# Patient Record
Sex: Female | Born: 2000 | Hispanic: Yes | Marital: Single | Smoking: Never smoker
Health system: Southern US, Community
[De-identification: ages and names within clinical notes are randomized; demographics above are authoritative.]

## PROBLEM LIST (undated history)

## (undated) DIAGNOSIS — K5090 Crohn's disease, unspecified, without complications: Secondary | ICD-10-CM

## (undated) DIAGNOSIS — D509 Iron deficiency anemia, unspecified: Secondary | ICD-10-CM

## (undated) DIAGNOSIS — T380X5A Adverse effect of glucocorticoids and synthetic analogues, initial encounter: Secondary | ICD-10-CM

## (undated) DIAGNOSIS — M8580 Other specified disorders of bone density and structure, unspecified site: Secondary | ICD-10-CM

## (undated) HISTORY — DX: Crohn's disease, unspecified, without complications: K50.90

## (undated) HISTORY — DX: Adverse effect of glucocorticoids and synthetic analogues, initial encounter: M85.80

## (undated) HISTORY — DX: Adverse effect of glucocorticoids and synthetic analogues, initial encounter: T38.0X5A

---
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
## 2018-01-31 DIAGNOSIS — K5090 Crohn's disease, unspecified, without complications: Secondary | ICD-10-CM | POA: Diagnosis not present

## 2018-01-31 DIAGNOSIS — E559 Vitamin D deficiency, unspecified: Secondary | ICD-10-CM | POA: Diagnosis not present

## 2018-03-09 DIAGNOSIS — J069 Acute upper respiratory infection, unspecified: Secondary | ICD-10-CM | POA: Diagnosis not present

## 2018-05-13 DIAGNOSIS — J069 Acute upper respiratory infection, unspecified: Secondary | ICD-10-CM | POA: Diagnosis not present

## 2018-05-13 DIAGNOSIS — R05 Cough: Secondary | ICD-10-CM | POA: Diagnosis not present

## 2018-08-22 DIAGNOSIS — L723 Sebaceous cyst: Secondary | ICD-10-CM | POA: Diagnosis not present

## 2018-08-22 DIAGNOSIS — K5010 Crohn's disease of large intestine without complications: Secondary | ICD-10-CM | POA: Diagnosis not present

## 2018-10-16 ENCOUNTER — Ambulatory Visit: Payer: Self-pay | Admitting: Family Medicine

## 2018-10-17 ENCOUNTER — Ambulatory Visit (INDEPENDENT_AMBULATORY_CARE_PROVIDER_SITE_OTHER): Payer: BC Managed Care – PPO | Admitting: Family Medicine

## 2018-10-17 ENCOUNTER — Encounter: Payer: Self-pay | Admitting: Family Medicine

## 2018-10-17 VITALS — BP 112/70 | HR 89 | Temp 98.8°F | Resp 14 | Ht 60.0 in | Wt 108.6 lb

## 2018-10-17 DIAGNOSIS — Z23 Encounter for immunization: Secondary | ICD-10-CM

## 2018-10-17 DIAGNOSIS — T380X5A Adverse effect of glucocorticoids and synthetic analogues, initial encounter: Secondary | ICD-10-CM

## 2018-10-17 DIAGNOSIS — K5010 Crohn's disease of large intestine without complications: Secondary | ICD-10-CM | POA: Diagnosis not present

## 2018-10-17 DIAGNOSIS — D849 Immunodeficiency, unspecified: Secondary | ICD-10-CM

## 2018-10-17 DIAGNOSIS — Z0000 Encounter for general adult medical examination without abnormal findings: Secondary | ICD-10-CM | POA: Diagnosis not present

## 2018-10-17 DIAGNOSIS — D899 Disorder involving the immune mechanism, unspecified: Secondary | ICD-10-CM | POA: Diagnosis not present

## 2018-10-17 DIAGNOSIS — M8580 Other specified disorders of bone density and structure, unspecified site: Secondary | ICD-10-CM | POA: Diagnosis not present

## 2018-10-17 LAB — TSH: TSH: 1.42 u[IU]/mL (ref 0.35–5.50)

## 2018-10-17 LAB — COMPREHENSIVE METABOLIC PANEL
ALT: 7 U/L (ref 0–35)
AST: 13 U/L (ref 0–37)
Albumin: 4.5 g/dL (ref 3.5–5.2)
Alkaline Phosphatase: 37 U/L — ABNORMAL LOW (ref 39–117)
BUN: 10 mg/dL (ref 6–23)
CO2: 25 mEq/L (ref 19–32)
Calcium: 9.4 mg/dL (ref 8.4–10.5)
Chloride: 105 mEq/L (ref 96–112)
Creatinine, Ser: 0.67 mg/dL (ref 0.40–1.20)
GFR: 111.9 mL/min (ref >60.00)
Glucose, Bld: 99 mg/dL (ref 70–99)
Potassium: 3.7 mEq/L (ref 3.5–5.1)
Sodium: 138 mEq/L (ref 135–145)
Total Bilirubin: 0.3 mg/dL (ref 0.2–1.2)
Total Protein: 7.3 g/dL (ref 6.0–8.3)

## 2018-10-17 LAB — CBC WITH DIFFERENTIAL/PLATELET
Basophils Absolute: 0 10*3/uL (ref 0.0–0.1)
Basophils Relative: 0.5 % (ref 0.0–3.0)
Eosinophils Absolute: 0.1 10*3/uL (ref 0.0–0.7)
Eosinophils Relative: 4.4 % (ref 0.0–5.0)
HCT: 32.5 % — ABNORMAL LOW (ref 36.0–46.0)
Hemoglobin: 10.8 g/dL — ABNORMAL LOW (ref 12.0–15.0)
Lymphocytes Relative: 26.7 % (ref 12.0–46.0)
Lymphs Abs: 0.9 10*3/uL (ref 0.7–4.0)
MCHC: 33.1 g/dL (ref 30.0–36.0)
MCV: 101.7 fl — ABNORMAL HIGH (ref 78.0–100.0)
Monocytes Absolute: 0.2 10*3/uL (ref 0.1–1.0)
Monocytes Relative: 7.2 % (ref 3.0–12.0)
Neutro Abs: 2 10*3/uL (ref 1.4–7.7)
Neutrophils Relative %: 61.2 % (ref 43.0–77.0)
Platelets: 241 10*3/uL (ref 150.0–400.0)
RBC: 3.2 Mil/uL — ABNORMAL LOW (ref 3.87–5.11)
RDW: 15.9 % — ABNORMAL HIGH (ref 11.5–14.6)
WBC: 3.2 10*3/uL — ABNORMAL LOW (ref 4.5–10.5)

## 2018-10-17 LAB — VITAMIN D 25 HYDROXY (VIT D DEFICIENCY, FRACTURES): VITD: 28.53 ng/mL — ABNORMAL LOW (ref 30.00–100.00)

## 2018-10-17 NOTE — Progress Notes (Signed)
Subjective  Chief Complaint  Patient presents with  . Establish Care    Previous PCP was at [facility], [place]  . Annual Exam    Non fasting    HPI: Claudia Martinez is a 18 y.o. female who presents to [facility] at [place] today for a Female Wellness Visit.   Wellness Visit: annual visit with health maintenance review and exam without Pap; sees gyn and gastro.   Very pleasant 18 year old female who was diagnosed with Crohn's disease 2 or 3 years ago.  She is currently well controlled on current medications.  She is managed by [facility] gastroenterology.  I have reviewed notes.  She is on chronic immunosuppression.  She feels well and has no concerns today.  Health maintenance: She sees a gynecologist.  She also sees an endocrinologist due to mild steroid-induced osteopenia.  She is on vitamin D and calcium therapy at times.  She is scheduled for her next bone density in a few months.  She is due for a Tdap.  Her immunizations are up-to-date.  Social history: She lives with her parents.  She has 2 older siblings who are out of the home.  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  She is happy without history of mood disorders.  Originally she is from [place].  They moved to [facility] about 7 years ago.  She likes it here.  Assessment  1. Annual physical exam   2. Crohn's disease of large intestine without complication ([place])   3. Immunosuppression ([place])   4. Need for Tdap vaccination   5. Steroid-induced osteopenia      Plan  Female Wellness Visit:  Age appropriate Health Maintenance and Prevention measures were discussed with patient. Included topics are cancer screening recommendations, ways to keep healthy (see AVS) including dietary and exercise recommendations, regular eye and dental care, use of seat belts, and avoidance of moderate alcohol use and tobacco use.   BMI: discussed patient's BMI and encouraged  positive lifestyle modifications to help get to or maintain a target BMI.  HM needs and immunizations were addressed and ordered. See below for orders. See HM and immunization section for updates.  Tdap updated today.  Routine labs and screening tests ordered including cmp, cbc and lipids where appropriate.  Discussed recommendations regarding Vit D and calcium supplementation (see AVS)  Chronic disease per GI.  Immunosuppressed  Follow up: Return in about 1 year (around 10/17/2019) for complete physical.   Orders Placed This Encounter  Procedures  . Tdap vaccine greater than or equal to 7yo IM  . CBC with Differential/Platelet  . Comprehensive metabolic panel  . TSH  . VITAMIN D 25 Hydroxy (Vit-D Deficiency, Fractures)   No orders of the defined types were placed in this encounter.     Lifestyle: Body mass index is 21.21 kg/m. Wt Readings from Last 3 Encounters:  10/17/18 108 lb 9.6 oz (49.3 kg)    Patient Active Problem List   Diagnosis Date Noted  . Steroid-induced osteopenia 10/17/2018    Sees Dr. Chalmers Cater   . Immunosuppression ([place]) 09/14/2015  . Crohn's disease of large intestine without complication ([place]) 81/27/5170   Health Maintenance  Topic Date Due  . HIV Screening  07/06/2013  . TETANUS/TDAP  07/06/2017  . INFLUENZA VACCINE  10/11/2018   There is no immunization history for the selected administration types on file for this patient. We updated and reviewed the patient's past history in  detail and it is documented below. Allergies: Patient is allergic to other. Past Medical History Patient  has a past medical history of Crohn disease ([place]), Iron deficiency anemia, and Steroid-induced osteopenia. Past Surgical History Patient  has no past surgical history on file. Family History: Patient family history is not on file. Social History:  Patient  reports that she has never smoked. She has never used smokeless tobacco. She reports that she does not drink  alcohol or use drugs.  Review of Systems: Constitutional: negative for fever or malaise Ophthalmic: negative for photophobia, double vision or loss of vision Cardiovascular: negative for chest pain, dyspnea on exertion, or new LE swelling Respiratory: negative for SOB or persistent cough Gastrointestinal: negative for abdominal pain, change in bowel habits or melena Genitourinary: negative for dysuria or gross hematuria, no abnormal uterine bleeding or disharge Musculoskeletal: negative for new gait disturbance or muscular weakness Integumentary: negative for new or persistent rashes, no breast lumps Neurological: negative for TIA or stroke symptoms Psychiatric: negative for SI or delusions Allergic/Immunologic: negative for hives Patient Care Team    Relationship Specialty Notifications Start End  Leamon Arnt, MD PCP - General Family Medicine  10/17/18     Objective  Vitals: BP 112/70   Pulse 89   Temp 98.8 F (37.1 C) (Tympanic)   Resp 14   Ht 5' (1.524 m)   Wt 108 lb 9.6 oz (49.3 kg)   LMP 09/24/2018   SpO2 100%   BMI 21.21 kg/m  General:  Well developed, well nourished, no acute distress, thin Psych:  Alert and orientedx3,normal mood and affect HEENT:  Normocephalic, atraumatic, non-icteric sclera, PERRL, oropharynx is clear without mass or exudate, supple neck without adenopathy, mass or thyromegaly Cardiovascular:  Normal S1, S2, RRR without gallop, rub or murmur, nondisplaced PMI Respiratory:  Good breath sounds bilaterally, CTAB with normal respiratory effort Gastrointestinal: normal bowel sounds, soft, non-tender, no noted masses. No HSM MSK: no deformities, contusions. Joints are without erythema or swelling. Spine and CVA region are nontender Skin:  Warm, no rashes or suspicious lesions noted Neurologic:    Mental status is normal. CN 2-11 are normal. Gross motor and sensory exams are normal. Normal gait. No tremor   Commons side effects, risks, benefits, and  alternatives for medications and treatment plan prescribed today were discussed, and the patient expressed understanding of the given instructions. Patient is instructed to call or message via MyChart if he/she has any questions or concerns regarding our treatment plan. No barriers to understanding were identified. We discussed Red Flag symptoms and signs in detail. Patient expressed understanding regarding what to do in case of urgent or emergency type symptoms.   Medication list was reconciled, printed and provided to the patient in AVS. Patient instructions and summary information was reviewed with the patient as documented in the AVS. This note was prepared with assistance of Dragon voice recognition software. Occasional wrong-word or sound-a-like substitutions may have occurred due to the inherent limitations of voice recognition software

## 2018-10-17 NOTE — Patient Instructions (Addendum)
Please return in 12 months for your annual complete physical; please come fasting.  I will release your lab results to you on your MyChart account with further instructions. Please reply with any questions.  Please sign up via the link I sent as a text message.   It was a pleasure meeting you today! Thank you for choosing [place] to meet your healthcare needs! I truly look forward to working with you. If you have any questions or concerns, please send me a message via Mychart or call the office at [phone].  Today you were given your Tdap vaccination. This is good for 10 years.  Preventive Care 20-59 Years Old, Female Preventive care refers to visits with your health care provider and lifestyle choices that can promote health and wellness. This includes:  A yearly physical exam. This may also be called an annual well check.  Regular dental visits and eye exams.  Immunizations.  Screening for certain conditions.  Healthy lifestyle choices, such as eating a healthy diet, getting regular exercise, not using drugs or products that contain nicotine and tobacco, and limiting alcohol use. What can I expect for my preventive care visit? Physical exam Your health care provider will check your:  Height and weight. This may be used to calculate body mass index (BMI), which tells if you are at a healthy weight.  Heart rate and blood pressure.  Skin for abnormal spots. Counseling Your health care provider may ask you questions about your:  Alcohol, tobacco, and drug use.  Emotional well-being.  Home and relationship well-being.  Sexual activity.  Eating habits.  Work and work Statistician.  Method of birth control.  Menstrual cycle.  Pregnancy history. What immunizations do I need?  Influenza (flu) vaccine  This is recommended every year. Tetanus, diphtheria, and pertussis (Tdap) vaccine  You may need a Td booster every 10 years. Varicella (chickenpox) vaccine  You may  need this if you have not been vaccinated. Human papillomavirus (HPV) vaccine  If recommended by your health care provider, you may need three doses over 6 months. Measles, mumps, and rubella (MMR) vaccine  You may need at least one dose of MMR. You may also need a second dose. Meningococcal conjugate (MenACWY) vaccine  One dose is recommended if you are age 73-21 years and a first-year college student living in a residence hall, or if you have one of several medical conditions. You may also need additional booster doses. Pneumococcal conjugate (PCV13) vaccine  You may need this if you have certain conditions and were not previously vaccinated. Pneumococcal polysaccharide (PPSV23) vaccine  You may need one or two doses if you smoke cigarettes or if you have certain conditions. Hepatitis A vaccine  You may need this if you have certain conditions or if you travel or work in places where you may be exposed to hepatitis A. Hepatitis B vaccine  You may need this if you have certain conditions or if you travel or work in places where you may be exposed to hepatitis B. Haemophilus influenzae type b (Hib) vaccine  You may need this if you have certain conditions. You may receive vaccines as individual doses or as more than one vaccine together in one shot (combination vaccines). Talk with your health care provider about the risks and benefits of combination vaccines. What tests do I need?  Blood tests  Lipid and cholesterol levels. These may be checked every 5 years starting at age 36.  Hepatitis C test.  Hepatitis B test.  Screening  Diabetes screening. This is done by checking your blood sugar (glucose) after you have not eaten for a while (fasting).  Sexually transmitted disease (STD) testing.  BRCA-related cancer screening. This may be done if you have a family history of breast, ovarian, tubal, or peritoneal cancers.  Pelvic exam and Pap test. This may be done every 3 years  starting at age 44. Starting at age 37, this may be done every 5 years if you have a Pap test in combination with an HPV test. Talk with your health care provider about your test results, treatment options, and if necessary, the need for more tests. Follow these instructions at home: Eating and drinking   Eat a diet that includes fresh fruits and vegetables, whole grains, lean protein, and low-fat dairy.  Take vitamin and mineral supplements as recommended by your health care provider.  Do not drink alcohol if: ? Your health care provider tells you not to drink. ? You are pregnant, may be pregnant, or are planning to become pregnant.  If you drink alcohol: ? Limit how much you have to 0-1 drink a day. ? Be aware of how much alcohol is in your drink. In the [place] one drink equals one 12 oz bottle of beer (355 mL), one 5 oz glass of wine (148 mL), or one 1 oz glass of hard liquor (44 mL). Lifestyle  Take daily care of your teeth and gums.  Stay active. Exercise for at least 30 minutes on 5 or more days each week.  Do not use any products that contain nicotine or tobacco, such as cigarettes, e-cigarettes, and chewing tobacco. If you need help quitting, ask your health care provider.  If you are sexually active, practice safe sex. Use a condom or other form of birth control (contraception) in order to prevent pregnancy and STIs (sexually transmitted infections). If you plan to become pregnant, see your health care provider for a preconception visit. What's next?  Visit your health care provider once a year for a well check visit.  Ask your health care provider how often you should have your eyes and teeth checked.  Stay up to date on all vaccines. This information is not intended to replace advice given to you by your health care provider. Make sure you discuss any questions you have with your health care provider. Document Released: 04/24/2001 Document Revised: 11/07/2017 Document  Reviewed: 11/07/2017 Elsevier Patient Education  2020 [employer].

## 2018-10-21 NOTE — Progress Notes (Signed)
Please add on Vit b12 and folate if it isnt too late, dx: anemia, macrocytic Thanks, Dr. Jonni Sanger '

## 2018-10-21 NOTE — Progress Notes (Signed)
Please call patient: I have reviewed his/her lab results. Labs mostly look fine but she is anemic: I recommend taking a MVI daily and iron daily to correct this. As well her vit d is a little low, so Vit d3 2000 units daily as well. Thanks.

## 2018-11-05 ENCOUNTER — Ambulatory Visit (INDEPENDENT_AMBULATORY_CARE_PROVIDER_SITE_OTHER): Payer: BC Managed Care – PPO | Admitting: Family Medicine

## 2018-11-05 ENCOUNTER — Encounter: Payer: Self-pay | Admitting: Family Medicine

## 2018-11-05 ENCOUNTER — Other Ambulatory Visit: Payer: Self-pay

## 2018-11-05 VITALS — Temp 101.1°F

## 2018-11-05 DIAGNOSIS — R509 Fever, unspecified: Secondary | ICD-10-CM | POA: Diagnosis not present

## 2018-11-05 DIAGNOSIS — L2489 Irritant contact dermatitis due to other agents: Secondary | ICD-10-CM | POA: Diagnosis not present

## 2018-11-05 DIAGNOSIS — R6889 Other general symptoms and signs: Secondary | ICD-10-CM | POA: Diagnosis not present

## 2018-11-05 DIAGNOSIS — Z20822 Contact with and (suspected) exposure to covid-19: Secondary | ICD-10-CM

## 2018-11-05 MED ORDER — TRIAMCINOLONE ACETONIDE 0.1 % EX CREA: 1.0000 "application " | TOPICAL_CREAM | Freq: Two times a day (BID) | CUTANEOUS | 0 refills | Status: DC

## 2018-11-05 NOTE — Progress Notes (Signed)
Virtual Visit via Video Note  Subjective  CC:  Chief Complaint  Patient presents with  . Ear irritation    Started over the weekend, has tried Benadryl spray and tabs, has tried OTC eczema creams with minimal relief.. Reports redness, raw, and some swelling to bilateral ears.  . Fever    Started today 101.1 (told to take Tylenol), does work at [employer], & unsure of exposure     I connected with Marlo Arriola on 11/05/18 at  2:20 PM EDT by a video enabled telemedicine application and verified that I am speaking with the correct person using two identifiers. Location patient: Home Location provider: [facility] [place], Office Persons participating in the virtual visit: Claudia Martinez, Leamon Arnt, MD Lilli Light, CMA, and patient's mother  I discussed the limitations of evaluation and management by telemedicine and the availability of in person appointments. The patient expressed understanding and agreed to proceed. HPI: Claudia Martinez is a 18 y.o. female who was contacted today to address the problems listed above in the chief complaint. . One week h/o ear soreness with cracking and ithcing and redness. Wears a medical mask all day at work. No URI or covid sxs. Then today with temp to 101. 99.1 after tyelnol. Feels well but "flushed" - thought related to ears. No ear pain. No cold sxs. No GI sxs.  Assessment  1. Irritant contact dermatitis due to other agents   2. Fever, unspecified fever cause      Plan   Irritant contact der:  Reassured. Inconsistent with cellulitis but at risk due to cracking. Will monitor. Start steroid cream. Avoid mask.   Fever: ? Cause. Test for covid. Monitor sxs. No work until cleared.  I discussed the assessment and treatment plan with the patient. The patient was provided an opportunity to ask questions and all were answered. The patient agreed with the plan and demonstrated an understanding of the instructions.   The  patient was advised to call back or seek an in-person evaluation if the symptoms worsen or if the condition fails to improve as anticipated. Follow up: No follow-ups on file.  Visit date not found  Meds ordered this encounter  Medications  . triamcinolone cream (KENALOG) 0.1 %    Sig: Apply 1 application topically 2 (two) times daily. For 2 weeks, then as needed    Dispense:  45 g    Refill:  0      I reviewed the patients updated PMH, FH, and SocHx.    Patient Active Problem List   Diagnosis Date Noted  . 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 Immunosuppression ([place]) 09/14/2015  . Crohn's disease of large intestine without complication ([place]) 72/53/6644   Current Meds  Medication Sig  . azaTHIOprine (IMURAN) 50 MG tablet Take 1 tablet by mouth 2 (two) times daily.  . folic acid (FOLVITE) 1 MG tablet Take 1 mg by mouth daily.  Marland Kitchen PENTASA 500 MG CR capsule Take 3 capsules by mouth 2 (two) times daily.    Allergies: Patient is allergic to other. Family History: Patient family history is not on file. Social History:  Patient  reports that she has never smoked. She has never used smokeless tobacco. She reports that she does not drink alcohol or use drugs.  Review of Systems: Constitutional: Negative for fever malaise or anorexia Cardiovascular: negative for chest pain Respiratory: negative for SOB or persistent cough Gastrointestinal: negative for abdominal pain  OBJECTIVE Vitals:  Temp (!) 101.1 F (38.4 C) , currently 99 General: no acute distress , A&Ox3 Bilateral ears are mildly red but nontender with some flaking and cracking visible.  Leamon Arnt, MD

## 2018-11-06 ENCOUNTER — Encounter: Payer: Self-pay | Admitting: Family Medicine

## 2018-11-06 ENCOUNTER — Ambulatory Visit (INDEPENDENT_AMBULATORY_CARE_PROVIDER_SITE_OTHER): Payer: BC Managed Care – PPO | Admitting: Family Medicine

## 2018-11-06 VITALS — Temp 98.5°F | Ht 60.0 in | Wt 109.0 lb

## 2018-11-06 DIAGNOSIS — R21 Rash and other nonspecific skin eruption: Secondary | ICD-10-CM

## 2018-11-06 LAB — NOVEL CORONAVIRUS, NAA: SARS-CoV-2, NAA: NOT DETECTED

## 2018-11-06 NOTE — Progress Notes (Signed)
     Virtual Visit via Video Note  Subjective  CC:  Chief Complaint  Patient presents with  . Rash    Back of both legs, above ankles     I connected with Aaron Mose on 11/06/18 at 11:00 AM EDT by a video enabled telemedicine application and verified that I am speaking with the correct person using two identifiers. Location patient: Home Location provider: [facility] [place], Office Persons participating in the virtual visit: Shakiya Mcneary, Leamon Arnt, MD Lilli Light, [place] discussed the limitations of evaluation and management by telemedicine and the availability of in person appointments. The patient expressed understanding and agreed to proceed. HPI: Claudia Martinez is a 18 y.o. female who was contacted today to address the problems listed above in the chief complaint. . See yesterdays note. No more fevers. Ears are doing better after 2 treatments with steroid cream. But this am itching rash on both calves. No hives or systemic rash. No new exposures. Otherwise feels fine.   Assessment  1. Rash      Plan   rash:  ? Viral exanthem vs new contact irritant for other. Will monitor. Start antihistamie. covid test pending due to recent fever.  I discussed the assessment and treatment plan with the patient. The patient was provided an opportunity to ask questions and all were answered. The patient agreed with the plan and demonstrated an understanding of the instructions.   The patient was advised to call back or seek an in-person evaluation if the symptoms worsen or if the condition fails to improve as anticipated. Follow up: No follow-ups on file.  Visit date not found  No orders of the defined types were placed in this encounter.     I reviewed the patients updated PMH, FH, and SocHx.    Patient Active Problem List   Diagnosis Date Noted  . 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 Immunosuppression ([place]) 09/14/2015  . Crohn's disease of  large intestine without complication ([place]) 25/00/3704   Current Meds  Medication Sig  . azaTHIOprine (IMURAN) 50 MG tablet Take 1 tablet by mouth 2 (two) times daily.  . folic acid (FOLVITE) 1 MG tablet Take 1 mg by mouth daily.  Marland Kitchen PENTASA 500 MG CR capsule Take 3 capsules by mouth 2 (two) times daily.  Marland Kitchen triamcinolone cream (KENALOG) 0.1 % Apply 1 application topically 2 (two) times daily. For 2 weeks, then as needed    Allergies: Patient is allergic to other. Family History: Patient family history is not on file. Social History:  Patient  reports that she has never smoked. She has never used smokeless tobacco. She reports that she does not drink alcohol or use drugs.  Review of Systems: Constitutional: Negative for fever malaise or anorexia Cardiovascular: negative for chest pain Respiratory: negative for SOB or persistent cough Gastrointestinal: negative for abdominal pain  OBJECTIVE Vitals: Temp 98.5 F (36.9 C) (Temporal)   Ht 5' (1.524 m)   Wt 109 lb (49.4 kg)   BMI 21.29 kg/m  General: no acute distress , A&Ox3 Skin: calves: maculopapular rash.  Leamon Arnt, MD

## 2018-11-07 ENCOUNTER — Ambulatory Visit (INDEPENDENT_AMBULATORY_CARE_PROVIDER_SITE_OTHER): Payer: BC Managed Care – PPO | Admitting: Family Medicine

## 2018-11-07 ENCOUNTER — Encounter: Payer: Self-pay | Admitting: Family Medicine

## 2018-11-07 ENCOUNTER — Telehealth: Payer: Self-pay | Admitting: General Practice

## 2018-11-07 ENCOUNTER — Telehealth: Payer: Self-pay | Admitting: Physical Therapy

## 2018-11-07 VITALS — BP 116/64 | HR 98 | Temp 98.3°F | Ht 59.0 in | Wt 105.0 lb

## 2018-11-07 DIAGNOSIS — J029 Acute pharyngitis, unspecified: Secondary | ICD-10-CM | POA: Diagnosis not present

## 2018-11-07 DIAGNOSIS — J020 Streptococcal pharyngitis: Secondary | ICD-10-CM | POA: Diagnosis not present

## 2018-11-07 LAB — POCT RAPID STREP A (OFFICE): Rapid Strep A Screen: POSITIVE — AB

## 2018-11-07 MED ORDER — PENICILLIN V POTASSIUM 500 MG PO TABS: 500.0000 mg | ORAL_TABLET | Freq: Two times a day (BID) | ORAL | 0 refills | Status: AC

## 2018-11-07 MED ORDER — PREDNISONE 20 MG PO TABS: ORAL_TABLET | ORAL | 0 refills | Status: DC

## 2018-11-07 NOTE — Progress Notes (Signed)
Subjective  CC:  Chief Complaint  Patient presents with  . Acute Visit  . Sore Throat    Started this morning  . Rash    On back of both legs    HPI: Claudia Martinez is a 18 y.o. female who presents to the office today to address the problems listed above in the chief complaint.  See last notes: ears are still red and itching in spite of steroid cream.  Today low grade fever again and now sore throat. Also with red bumpy rash, worse on legs but some on back.   No n/v/d/abd pain, heachache, myalgias. covid testing was negative.   Immunosuprressed.   Assessment  1. Strep pharyngitis   2. Sore throat      Plan   Strep throat in immunosuppressed patient with scarlatiniform rash:  Educated and counseling. pcn x 5 days and supportive care  Add pred for irritant derm on ears. Doesn't look like cellulitis.   Follow up: prn  Visit date not found  Orders Placed This Encounter  Procedures  . POCT rapid strep A   Meds ordered this encounter  Medications  . penicillin v potassium (VEETID) 500 MG tablet    Sig: Take 1 tablet (500 mg total) by mouth 2 (two) times daily for 10 days.    Dispense:  20 tablet    Refill:  0  . predniSONE (DELTASONE) 20 MG tablet    Sig: Take 3 tabs daily for 3 days    Dispense:  9 tablet    Refill:  0      I reviewed the patients updated PMH, FH, and SocHx.    Patient Active Problem List   Diagnosis Date Noted  . 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 Immunosuppression ([place]) 09/14/2015  . Crohn's disease of large intestine without complication ([place]) 83/29/1916   Current Meds  Medication Sig  . azaTHIOprine (IMURAN) 50 MG tablet Take 1 tablet by mouth 2 (two) times daily.  . folic acid (FOLVITE) 1 MG tablet Take 1 mg by mouth daily.  Marland Kitchen PENTASA 500 MG CR capsule Take 3 capsules by mouth 2 (two) times daily.  Marland Kitchen triamcinolone cream (KENALOG) 0.1 % Apply 1 application topically 2 (two) times daily. For 2 weeks, then as needed     Allergies: Patient is allergic to other. Family History: Patient family history is not on file. Social History:  Patient  reports that she has never smoked. She has never used smokeless tobacco. She reports that she does not drink alcohol or use drugs.  Review of Systems: Constitutional: Negative for fever malaise or anorexia Cardiovascular: negative for chest pain Respiratory: negative for SOB or persistent cough Gastrointestinal: negative for abdominal pain  Objective  Vitals: BP 116/64 (BP Location: Left Arm, Patient Position: Sitting, Cuff Size: Normal)   Pulse 98   Temp 98.3 F (36.8 C) (Oral)   Ht 4' 11"  (1.499 m)   Wt 105 lb (47.6 kg)   SpO2 100%   BMI 21.21 kg/m  General: no acute distress , A&Ox3 HEENT: PEERL, conjunctiva normal, Oropharynx moist and red w/o exudate, no lad, ears are red but not swollen or warm, some flaking,neck is supple Cardiovascular:  RRR without murmur or gallop.  Respiratory:  Good breath sounds bilaterally, CTAB with normal respiratory effort Skin:  Warm, back with fine micropapillary rash; posterior calves with more prominent large maculopapular rash.   Office Visit on 11/07/2018  Component Date Value Ref Range Status  . Rapid Strep A Screen  11/07/2018 Positive* Negative Final      Commons side effects, risks, benefits, and alternatives for medications and treatment plan prescribed today were discussed, and the patient expressed understanding of the given instructions. Patient is instructed to call or message via MyChart if he/she has any questions or concerns regarding our treatment plan. No barriers to understanding were identified. We discussed Red Flag symptoms and signs in detail. Patient expressed understanding regarding what to do in case of urgent or emergency type symptoms.   Medication list was reconciled, printed and provided to the patient in AVS. Patient instructions and summary information was reviewed with the patient as  documented in the AVS. This note was prepared with assistance of Dragon voice recognition software. Occasional wrong-word or sound-a-like substitutions may have occurred due to the inherent limitations of voice recognition software

## 2018-11-07 NOTE — Telephone Encounter (Signed)
Patient informed of negative covid-19 result. Patient verbalized understanding.

## 2018-11-07 NOTE — Telephone Encounter (Signed)
Copied from [place] [id number]. Topic: General - Other >> Nov 07, 2018  3:02 PM Leward Quan A wrote: Reason for CRM: Patient mom called to say speak to Dr Jonni Sanger because she was not happy with the report of the treatment that Dr Jonni Sanger gave her daughter during her visit. Per patient  ""Dr Jonni Sanger stated that patient does not have to call her doctor for every little symptom"" mom Dyann Ruddle is asking for a call from Dr Jonni Sanger when she get a chance so that this issue can be resolved. Per mom after patients original complaint and getting tested and ruled out for covid patient still had symptoms and went back because Dr Jonni Sanger say come in if symptoms persist. Dyann Ruddle can be reached at Ph# [phone]

## 2018-11-07 NOTE — Patient Instructions (Signed)
Please follow up if symptoms do not improve or as needed.    Strep Throat, Adult Strep throat is an infection in the throat that is caused by bacteria. It is common during the cold months of the year. It mostly affects children who are 80-20 years old. However, people of all ages can get it at any time of the year. This infection spreads from person to person (is contagious) through coughing, sneezing, or having close contact. Your health care provider may use other names to describe the infection. It can be called tonsillitis (if there is swelling of the tonsils), or pharyngitis (if there is swelling at the back of the throat). What are the causes? This condition is caused by the Streptococcus pyogenes bacteria. What increases the risk? You are more likely to develop this condition if:  You care for school-age children, or are around school-age children. Children are more likely to get strep throat and may spread it to others.  You spend time in crowded places where the infection can spread easily.  You have close contact with someone who has strep throat. What are the signs or symptoms? Symptoms of this condition include:  Fever or chills.  Redness, swelling, or pain in the tonsils or throat.  Pain or difficulty when swallowing.  White or yellow spots on the tonsils or throat.  Tender glands in the neck and under the jaw.  Bad smelling breath.  Red rash all over the body. This is rare. How is this diagnosed? This condition is diagnosed by tests that check for the presence and the amount of bacteria that cause strep throat. They are:  Rapid strep test. Your throat is swabbed and checked for the presence of bacteria. Results are usually ready in minutes.  Throat culture test. Your throat is swabbed. The sample is placed in a cup that allows infections to grow. Results are usually ready in 1 or 2 days. How is this treated? This condition may be treated with:  Medicines that  kill germs (antibiotics).  Medicines that relieve pain or fever. These include: ? Ibuprofen or acetaminophen. ? Aspirin, only for patients who are over the age of 47. ? Throat lozenges. ? Throat sprays. Follow these instructions at home: Medicines   Take over-the-counter and prescription medicines only as told by your health care provider.  Take your antibiotic medicine as told by your health care provider. Do not stop taking the antibiotic even if you start to feel better. Eating and drinking   If you have trouble swallowing, try eating soft foods until your sore throat feels better.  Drink enough fluid to keep your urine pale yellow.  To help relieve pain, you may have: ? Warm fluids, such as soup and tea. ? Cold fluids, such as frozen desserts or popsicles. General instructions  Gargle with a salt-water mixture 3-4 times a day or as needed. To make a salt-water mixture, completely dissolve -1 tsp (3-6 g) of salt in 1 cup (237 mL) of warm water.  Get plenty of rest.  Stay home from work or school until you have been taking antibiotics for 24 hours.  Avoid smoking or being around people who smoke.  Keep all follow-up visits as told by your health care provider. This is important. How is this prevented?   Do not share food, drinking cups, or personal items that could cause the infection to spread to other people.  Wash your hands well with soap and water, and make sure that all  people in your house wash their hands well.  Have family members tested if they have a sore throat or fever. They may need an antibiotic if they have strep throat. Contact a health care provider if:  The glands in your neck continue to get bigger.  You develop a rash, cough, or earache.  You cough up a thick mucus that is green, yellow-brown, or bloody.  You have pain or discomfort that does not get better with medicine.  Your symptoms seem to be getting worse and not better.  You have a  fever. Get help right away if:  You have new symptoms, such as vomiting, severe headache, stiff or painful neck, chest pain, or shortness of breath.  You have severe throat pain, drooling, or changes in your voice.  You have swelling of the neck, or the skin on the neck becomes red and tender.  You have signs of dehydration, such as tiredness (fatigue), dry mouth, and decreased urination.  You become increasingly sleepy, or you cannot wake up completely.  Your joints become red or painful. Summary  Strep throat is an infection in the throat that is caused by the Streptococcus pyogenes bacteria. This infection is spread from person to person (is contagious) through coughing, sneezing, or having close contact.  Take your medicines, including antibiotics, as told by your health care provider. Do not stop taking the antibiotic even if you start to feel better.  To prevent the spread of germs, wash your hands well with soap and water. Have others do the same. Do not share food, drinking cups, or personal items.  Get help right away if you have new symptoms, such as vomiting, severe headache, stiff or painful neck, chest pain, or shortness of breath. This information is not intended to replace advice given to you by your health care provider. Make sure you discuss any questions you have with your health care provider. Document Released: 02/24/2000 Document Revised: 05/16/2018 Document Reviewed: 05/16/2018 Elsevier Patient Education  [place].

## 2018-11-10 NOTE — Telephone Encounter (Signed)
LMOM for Claudia Martinez, [place] mom. I will try her back or she can call me again.

## 2018-11-10 NOTE — Telephone Encounter (Signed)
See below

## 2018-11-10 NOTE — Telephone Encounter (Signed)
Spoke to both patient and her mother.

## 2018-11-14 DIAGNOSIS — L309 Dermatitis, unspecified: Secondary | ICD-10-CM | POA: Diagnosis not present

## 2018-11-18 DIAGNOSIS — M810 Age-related osteoporosis without current pathological fracture: Secondary | ICD-10-CM | POA: Diagnosis not present

## 2018-11-18 DIAGNOSIS — K5090 Crohn's disease, unspecified, without complications: Secondary | ICD-10-CM | POA: Diagnosis not present

## 2018-11-18 DIAGNOSIS — Z3040 Encounter for surveillance of contraceptives, unspecified: Secondary | ICD-10-CM | POA: Diagnosis not present

## 2018-11-18 DIAGNOSIS — Z01419 Encounter for gynecological examination (general) (routine) without abnormal findings: Secondary | ICD-10-CM | POA: Diagnosis not present

## 2018-11-18 DIAGNOSIS — M8580 Other specified disorders of bone density and structure, unspecified site: Secondary | ICD-10-CM | POA: Diagnosis not present

## 2018-11-18 DIAGNOSIS — Z6821 Body mass index (BMI) 21.0-21.9, adult: Secondary | ICD-10-CM | POA: Diagnosis not present

## 2018-12-09 DIAGNOSIS — M810 Age-related osteoporosis without current pathological fracture: Secondary | ICD-10-CM | POA: Diagnosis not present

## 2018-12-09 DIAGNOSIS — E559 Vitamin D deficiency, unspecified: Secondary | ICD-10-CM | POA: Diagnosis not present

## 2018-12-09 DIAGNOSIS — K50919 Crohn's disease, unspecified, with unspecified complications: Secondary | ICD-10-CM | POA: Diagnosis not present

## 2018-12-09 DIAGNOSIS — M8580 Other specified disorders of bone density and structure, unspecified site: Secondary | ICD-10-CM | POA: Diagnosis not present

## 2019-02-19 DIAGNOSIS — K5090 Crohn's disease, unspecified, without complications: Secondary | ICD-10-CM | POA: Diagnosis not present

## 2019-03-11 DIAGNOSIS — K5090 Crohn's disease, unspecified, without complications: Secondary | ICD-10-CM | POA: Diagnosis not present

## 2019-04-13 ENCOUNTER — Telehealth: Payer: Self-pay

## 2019-04-13 DIAGNOSIS — R509 Fever, unspecified: Secondary | ICD-10-CM | POA: Diagnosis not present

## 2019-04-13 NOTE — Telephone Encounter (Signed)
I called and spoke to mother. She said that her daughter went to urgent care and they would only perform a COVID and FLU test which were both negative. Mother states that she had concerns about how an office rep spoke to her this morning.The mother had concerns about the provider not being able to see in her  daughter's throat. Dr. Jonni Sanger advised that she could use tylenol or ibuprofen for fever until she can see her tomorrow. Mother verbalized understanding.

## 2019-04-13 NOTE — Telephone Encounter (Signed)
Please advise 

## 2019-04-13 NOTE — Telephone Encounter (Signed)
Patient mother called in to schedule her daughter appt for fever and red / sore throat... I offered patient a virtual appt and mother start to complain because she wanted to know how can Dr. Jonni Sanger see a red throat from virtual appt. I informed mother that as long as daughter is having symptoms we are only offering virtual appts. If she feel like pt need to be seen thn the next step would be urgent care or er. Mother begins to get angry stating that she should not being paying insurance since they cant come into office and that the last time she was here they had to get covid tested before entering the office. Mother want to know if pt got covid tested could she come in the office. I explained to the patient if she wanted to get covid tested she could but as long as daughter is having symptoms she can't come into the office unless Dr. Jonni Sanger approved it. Mother is requesting a call back.

## 2019-04-13 NOTE — Telephone Encounter (Signed)
Please see below and speak with mother.  I can do virtual visit for testing or empiric treatment of ST sxs if needed. Thanks.

## 2019-04-14 ENCOUNTER — Ambulatory Visit (INDEPENDENT_AMBULATORY_CARE_PROVIDER_SITE_OTHER): Payer: BC Managed Care – PPO | Admitting: Family Medicine

## 2019-04-14 ENCOUNTER — Encounter: Payer: Self-pay | Admitting: Family Medicine

## 2019-04-14 ENCOUNTER — Other Ambulatory Visit: Payer: Self-pay

## 2019-04-14 VITALS — BP 104/60 | HR 106 | Temp 98.6°F

## 2019-04-14 DIAGNOSIS — D849 Immunodeficiency, unspecified: Secondary | ICD-10-CM

## 2019-04-14 DIAGNOSIS — K5010 Crohn's disease of large intestine without complications: Secondary | ICD-10-CM

## 2019-04-14 DIAGNOSIS — J029 Acute pharyngitis, unspecified: Secondary | ICD-10-CM

## 2019-04-14 LAB — POCT RAPID STREP A (OFFICE): Rapid Strep A Screen: NEGATIVE

## 2019-04-14 NOTE — Progress Notes (Signed)
Subjective  CC:  Chief Complaint  Patient presents with  . Sore Throat    with low grade fever and cough x 2 days. No otc meds today. loose stools x 1 day     HPI: Claudia Martinez is a 19 y.o. female who presents to the office today to address the problems listed above in the chief complaint.  C/o sore throat, mild URI sxs with low grade fever, tmax 99s. No SOB but mild nausea, diarrhea w/o vomiting. Has crohn's disease and was starting a mild flare prior to this illness. Has lower abdominal cramping. No loss of taste or smell. Minimal dry cough. No myalgias. No ear or sinus pain . No known exposure ot strep or mono. OTC analgesics have been used with minimal or mild relief. Eating and drinking OK. Went to [facility] yesterday for rapid covid and flu screen: both negative. PCR covid test is pending.    I reviewed the patients updated PMH, FH, and SocHx.    Patient Active Problem List   Diagnosis Date Noted  . 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 Immunosuppression ([place]) 09/14/2015  . Crohn's disease of large intestine without complication ([place]) 92/01/9416   Current Meds  Medication Sig  . azaTHIOprine (IMURAN) 50 MG tablet Take 1 tablet by mouth 2 (two) times daily.  . ferrous sulfate 325 (65 FE) MG tablet Iron (ferrous sulfate) 325 mg (65 mg iron) tablet  Take 1 tablet every day by oral route.  . folic acid (FOLVITE) 1 MG tablet Take 1 mg by mouth daily.  Marland Kitchen PENTASA 500 MG CR capsule Take 3 capsules by mouth 2 (two) times daily.    Allergies: Patient is allergic to other.  Review of Systems: Constitutional: Negative for  malaise or anorexia Cardiovascular: negative for chest pain Respiratory: negative for SOB or persistent cough Gastrointestinal: negative for moderate or severe abdominal pain  Objective  Vitals: BP 104/60   Pulse (!) 106   Temp 98.6 F (37 C) (Oral)   SpO2 98%  General: no acute distress , A&Ox3 HEENT: PEERL, conjunctiva normal,  bilateral EAC and TMs are normal. Nares normal. Oropharynx moist with erythematous posterior pharynx without exudate, + cervical LAD, 2+ tonsils, midline uvula, neck is supple Cardiovascular:  RRR without murmur or gallop.  Respiratory:  Good breath sounds bilaterally, CTAB with normal respiratory effort Gastrointestinal: soft, flat abdomen, normal active bowel sounds, no palpable masses, no hepatosplenomegaly, no appreciated hernias.mild lower quadrant ttp w/o rebound or guarding Skin:  Warm, no rashes  Office Visit on 04/14/2019  Component Date Value Ref Range Status  . Rapid Strep A Screen 04/14/2019 Negative  Negative Final    Assessment  1. Sore throat      Plan   Possible viral pharyngitis or Covid -19 infection; await strep culture and pcr covid test results. Defer abx until throat culture returns due to possible active crohn's flare. Supportive care with  tylenol, gargles etc discussed. . RTO if sxs persist or worsen.   Follow up: aug cpe     Commons side effects, risks, benefits, and alternatives for medications and treatment plan prescribed today were discussed, and the patient expressed understanding of the given instructions. Patient is instructed to call or message via MyChart if he/she has any questions or concerns regarding our treatment plan. No barriers to understanding were identified. We discussed Red Flag symptoms and signs in detail. Patient expressed understanding regarding what to do in case of urgent or emergency type symptoms.  Medication list was reconciled, printed and provided to the patient in AVS. Patient instructions and summary information was reviewed with the patient as documented in the AVS. This note was prepared with assistance of Dragon voice recognition software. Occasional wrong-word or sound-a-like substitutions may have occurred due to the inherent limitations of voice recognition software  Orders Placed This Encounter  Procedures  . POCT rapid  strep A   No orders of the defined types were placed in this encounter.

## 2019-04-14 NOTE — Patient Instructions (Signed)
Please return in august for your annual complete physical; please come fasting. Sooner if you are not feeling well.   I will let you know about your strep culture results.   If you have any questions or concerns, please don't hesitate to send me a message via MyChart or call the office at [phone]. Thank you for visiting with [place] today! It's our pleasure caring for you.   Pharyngitis  Pharyngitis is redness, pain, and swelling (inflammation) of the throat (pharynx). It is a very common cause of sore throat. Pharyngitis can be caused by a bacteria, but it is usually caused by a virus. Most cases of pharyngitis get better on their own without treatment. What are the causes? This condition may be caused by:  Infection by viruses (viral). Viral pharyngitis spreads from person to person (is contagious) through coughing, sneezing, and sharing of personal items or utensils such as cups, forks, spoons, and toothbrushes.  Infection by bacteria (bacterial). Bacterial pharyngitis may be spread by touching the nose or face after coming in contact with the bacteria, or through more intimate contact, such as kissing.  Allergies. Allergies can cause buildup of mucus in the throat (post-nasal drip), leading to inflammation and irritation. Allergies can also cause blocked nasal passages, forcing breathing through the mouth, which dries and irritates the throat. What increases the risk? You are more likely to develop this condition if:  You are 48-62 years old.  You are exposed to crowded environments such as daycare, school, or dormitory living.  You live in a cold climate.  You have a weakened disease-fighting (immune) system. What are the signs or symptoms? Symptoms of this condition vary by the cause (viral, bacterial, or allergies) and can include:  Sore throat.  Fatigue.  Low-grade fever.  Headache.  Joint pain and muscle aches.  Skin rashes.  Swollen glands in the throat (lymph  nodes).  Plaque-like film on the throat or tonsils. This is often a symptom of bacterial pharyngitis.  Vomiting.  Stuffy nose (nasal congestion).  Cough.  Red, itchy eyes (conjunctivitis).  Loss of appetite. How is this diagnosed? This condition is often diagnosed based on your medical history and a physical exam. Your health care provider will ask you questions about your illness and your symptoms. A swab of your throat may be done to check for bacteria (rapid strep test). Other lab tests may also be done, depending on the suspected cause, but these are rare. How is this treated? This condition usually gets better in 3-4 days without medicine. Bacterial pharyngitis may be treated with antibiotic medicines. Follow these instructions at home:  Take over-the-counter and prescription medicines only as told by your health care provider. ? If you were prescribed an antibiotic medicine, take it as told by your health care provider. Do not stop taking the antibiotic even if you start to feel better. ? Do not give children aspirin because of the association with Reye syndrome.  Drink enough water and fluids to keep your urine clear or pale yellow.  Get a lot of rest.  Gargle with a salt-water mixture 3-4 times a day or as needed. To make a salt-water mixture, completely dissolve -1 tsp of salt in 1 cup of warm water.  If your health care provider approves, you may use throat lozenges or sprays to soothe your throat. Contact a health care provider if:  You have large, tender lumps in your neck.  You have a rash.  You cough up green, yellow-brown, or  bloody spit. Get help right away if:  Your neck becomes stiff.  You drool or are unable to swallow liquids.  You cannot drink or take medicines without vomiting.  You have severe pain that does not go away, even after you take medicine.  You have trouble breathing, and it is not caused by a stuffy nose.  You have new pain and  swelling in your joints such as the knees, ankles, wrists, or elbows. Summary  Pharyngitis is redness, pain, and swelling (inflammation) of the throat (pharynx).  While pharyngitis can be caused by a bacteria, the most common causes are viral.  Most cases of pharyngitis get better on their own without treatment.  Bacterial pharyngitis is treated with antibiotic medicines. This information is not intended to replace advice given to you by your health care provider. Make sure you discuss any questions you have with your health care provider. Document Revised: 02/08/2017 Document Reviewed: 04/03/2016 Elsevier Patient Education  2020 [employer].

## 2019-04-14 NOTE — Addendum Note (Signed)
Addended by: Francella Solian on: 04/14/2019 02:56 PM   Modules accepted: Orders

## 2019-04-16 LAB — CULTURE, GROUP A STREP
MICRO NUMBER:: 10107814
SPECIMEN QUALITY:: ADEQUATE

## 2019-04-16 NOTE — Progress Notes (Signed)
Please call patient: I have reviewed his/her lab results. Please let her know that her strep culture was negative. Has she received the results of her final covid test? thanks

## 2019-04-17 ENCOUNTER — Other Ambulatory Visit: Payer: Self-pay | Admitting: Gastroenterology

## 2019-04-17 DIAGNOSIS — K5090 Crohn's disease, unspecified, without complications: Secondary | ICD-10-CM | POA: Diagnosis not present

## 2019-04-17 DIAGNOSIS — K50919 Crohn's disease, unspecified, with unspecified complications: Secondary | ICD-10-CM

## 2019-04-17 DIAGNOSIS — A09 Infectious gastroenteritis and colitis, unspecified: Secondary | ICD-10-CM | POA: Diagnosis not present

## 2019-04-23 ENCOUNTER — Other Ambulatory Visit: Payer: Self-pay | Admitting: Gastroenterology

## 2019-04-27 ENCOUNTER — Ambulatory Visit
Admission: RE | Admit: 2019-04-27 | Discharge: 2019-04-27 | Disposition: A | Discharge status: Home / Self Care | Payer: BC Managed Care – PPO | Source: Ambulatory Visit | Attending: Gastroenterology | Admitting: Gastroenterology

## 2019-04-27 DIAGNOSIS — R109 Unspecified abdominal pain: Secondary | ICD-10-CM | POA: Diagnosis not present

## 2019-04-27 DIAGNOSIS — K50919 Crohn's disease, unspecified, with unspecified complications: Secondary | ICD-10-CM

## 2019-04-27 IMAGING — CT CT ABD-PELV W/ CM
2 of 4 series · 12 of 46 positions shown, 14 images · IV contrast (iopamidol)
Comparison: None.

CLINICAL DATA: History of Crohn's disease with blood in stool in
low abdominal pain.

EXAM:
CT ABDOMEN AND PELVIS WITH CONTRAST
TECHNIQUE: Multidetector CT imaging of the abdomen and pelvis was performed
using the standard protocol following bolus administration of
intravenous contrast.
CONTRAST:  100mL [UK] IOPAMIDOL ([UK]) INJECTION 61%

[Series 2: abd pelvis 5.00 br40 s3 axial · axial · 0.41mm/px · z∈[+1330,+1680]mm · 9 of 84 slices shown, 11 images]
[im 7/84  soft-tissue]
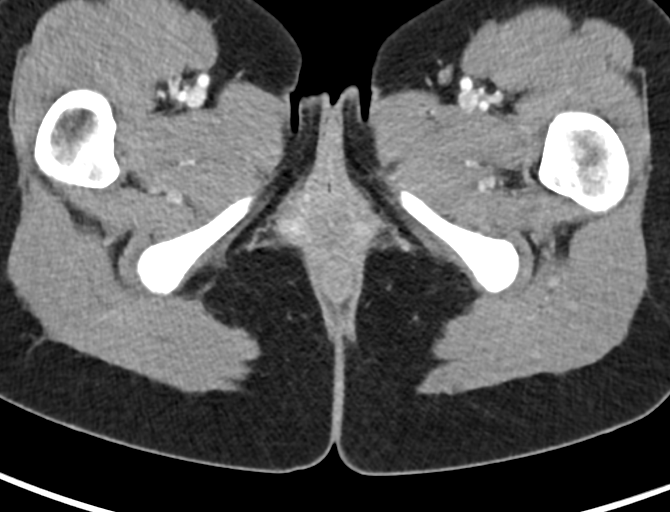
[im 7/84  bone]
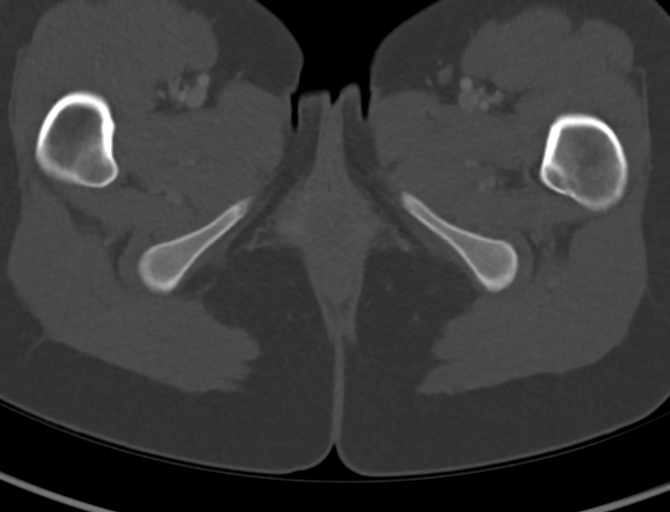
[im 14/84  soft-tissue]
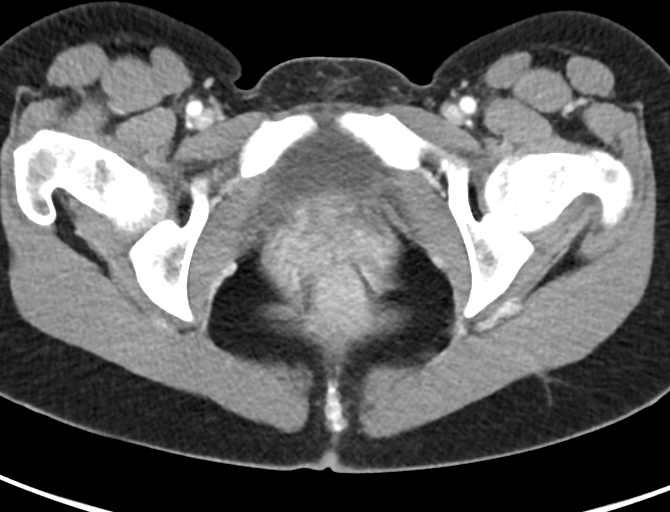
[im 25/84  soft-tissue]
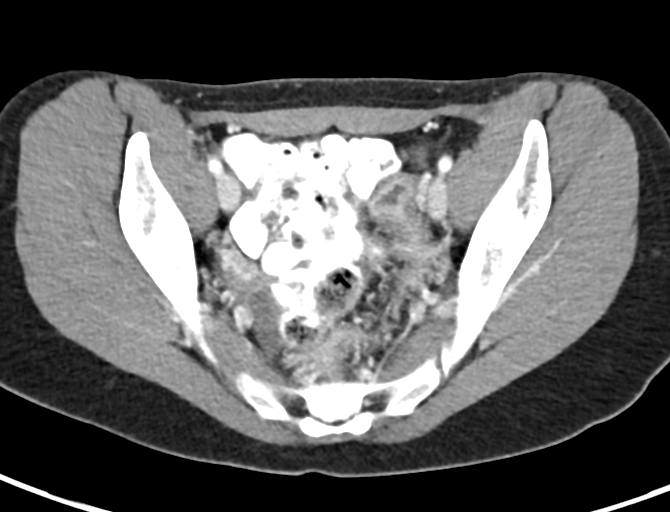
[im 32/84  soft-tissue]
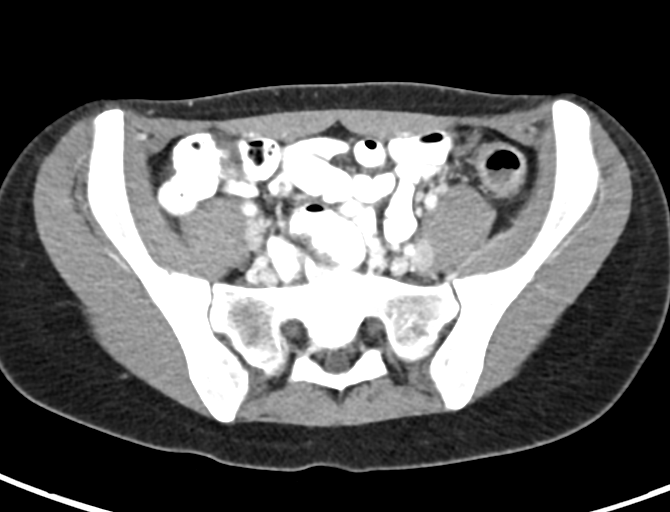
[im 42/84  soft-tissue]
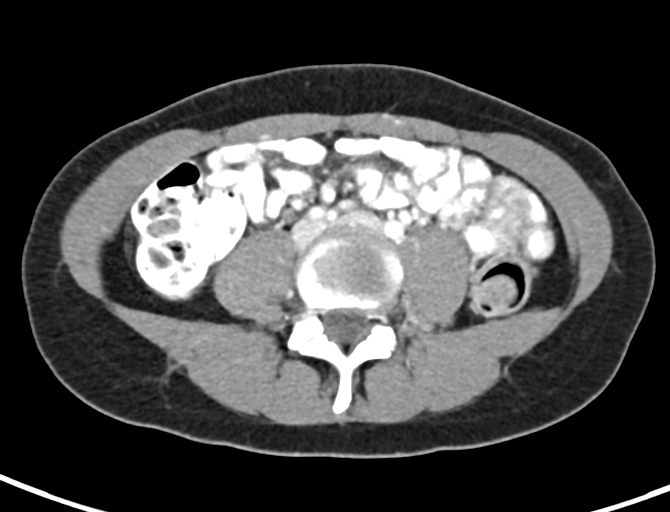
[im 52/84  soft-tissue]
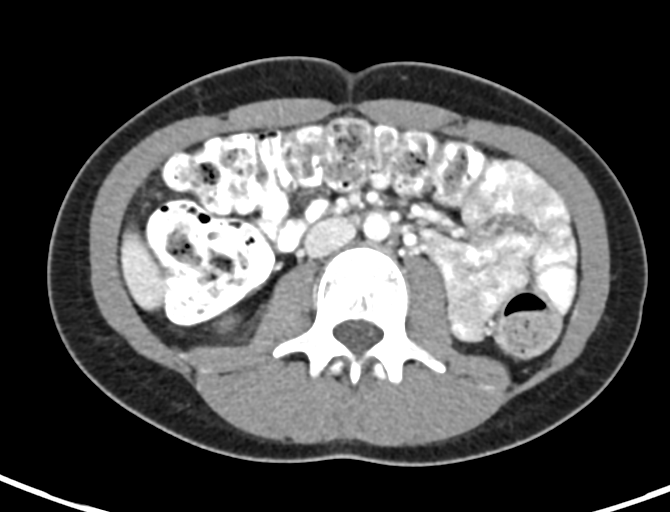
[im 59/84  soft-tissue]
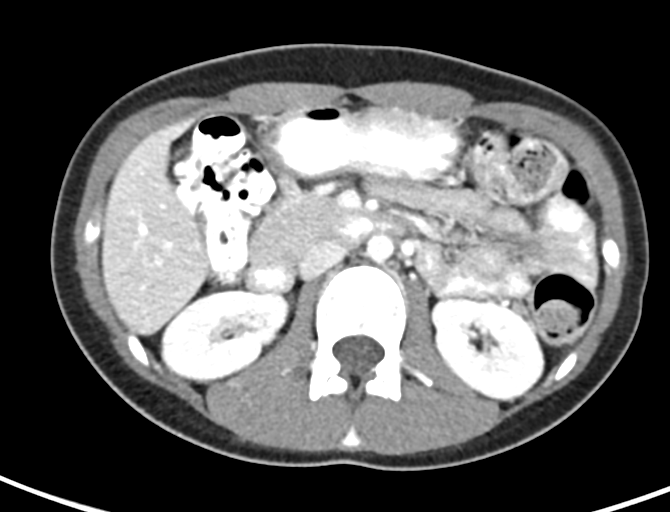
[im 70/84  soft-tissue]
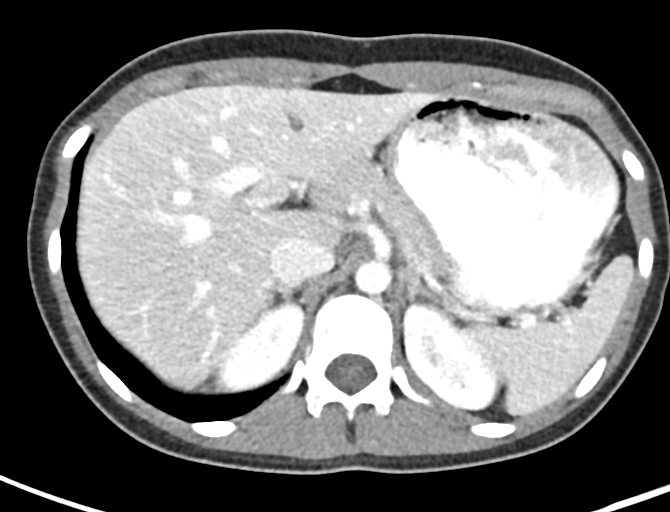
[im 77/84  soft-tissue]
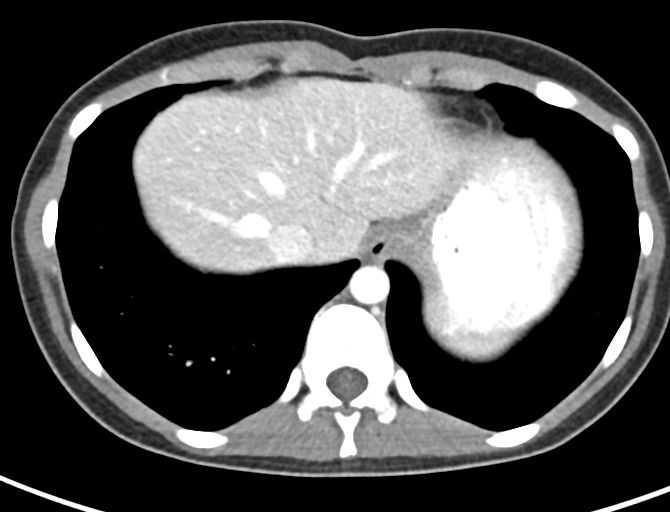
[im 77/84  bone]
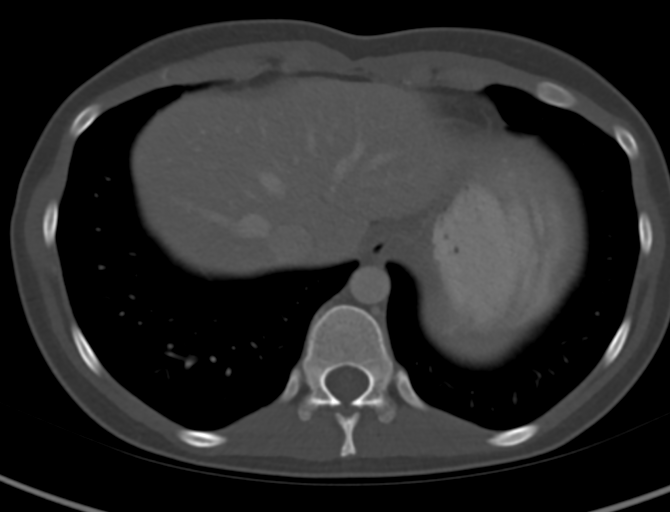

[Series 6: abd pelvis 2.00 br40 s3 cor · coronal · 0.54mm/px · 3 of 105 slices shown]
[im 35/105  soft-tissue]
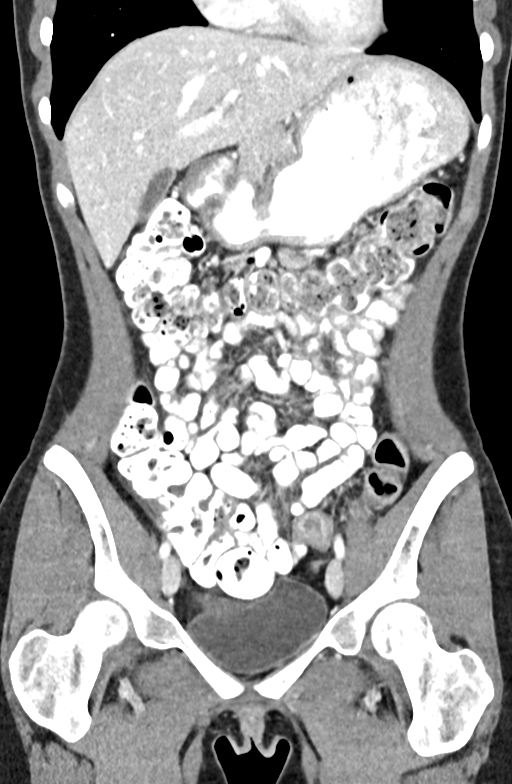
[im 47/105  soft-tissue]
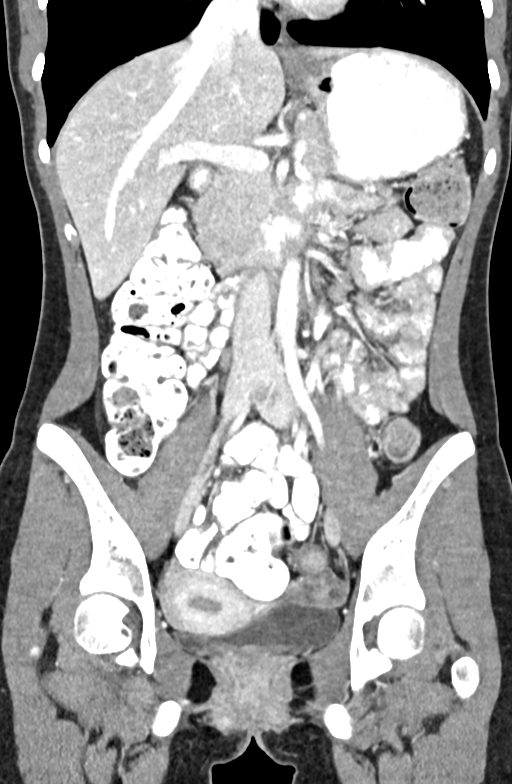
[im 58/105  soft-tissue]
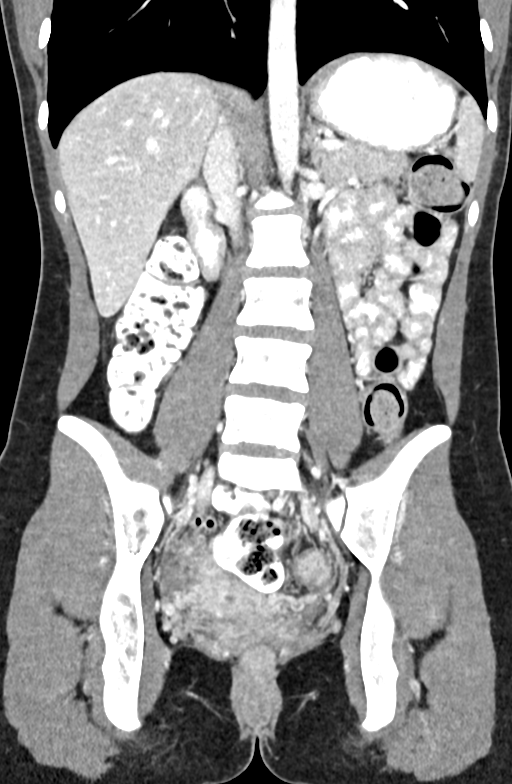

[12 of 46 positions shown; findings below may reference images not displayed]

FINDINGS: Lower chest: The lung bases are clear. The heart size is normal.

Hepatobiliary: The liver is normal. Normal gallbladder.There is no
biliary ductal dilation.

Pancreas: Normal contours without ductal dilatation. No
peripancreatic fluid collection.

Spleen: No splenic laceration or hematoma.

Adrenals/Urinary Tract:

--Adrenal glands: No adrenal hemorrhage.

--Right kidney/ureter: No hydronephrosis or perinephric hematoma.

--Left kidney/ureter: No hydronephrosis or perinephric hematoma.

--Urinary bladder: Unremarkable.

Stomach/Bowel:

--Stomach/Duodenum: No hiatal hernia or other gastric abnormality.
Normal duodenal course and caliber.

--Small bowel: No dilatation or inflammation.

--Colon: There is diffuse circumferential wall thickening of the
sigmoid colon and rectum. There is no free air. No abscess. The
stool burden is above average.

--Appendix: Normal.

Vascular/Lymphatic: Normal course and caliber of the major abdominal
vessels.

--No retroperitoneal lymphadenopathy.

--No mesenteric lymphadenopathy.

--No pelvic or inguinal lymphadenopathy.

Reproductive: Unremarkable

Other: There is a small volume of pelvic free fluid which is likely
physiologic. No free air. The abdominal wall is normal.

Musculoskeletal. No acute displaced fractures.
IMPRESSION: 1. Rectosigmoid colitis (infectious or inflammatory) with above
average stool burden. No bowel obstruction or perforation.
2. Normal appendix.
3. Small volume pelvic free fluid is likely physiologic.

## 2019-04-27 MED ORDER — IOPAMIDOL (ISOVUE-300) INJECTION 61%
100.0000 mL | Freq: Once | INTRAVENOUS | Status: AC | PRN
  Administered 2019-04-27: 15:00:00 100 mL via INTRAVENOUS

## 2019-05-05 ENCOUNTER — Telehealth: Payer: Self-pay | Admitting: Family Medicine

## 2019-05-05 NOTE — Telephone Encounter (Signed)
error 

## 2019-06-26 DIAGNOSIS — E559 Vitamin D deficiency, unspecified: Secondary | ICD-10-CM | POA: Diagnosis not present

## 2019-06-26 DIAGNOSIS — K50919 Crohn's disease, unspecified, with unspecified complications: Secondary | ICD-10-CM | POA: Diagnosis not present

## 2019-08-24 DIAGNOSIS — K5090 Crohn's disease, unspecified, without complications: Secondary | ICD-10-CM | POA: Diagnosis not present

## 2019-08-31 DIAGNOSIS — K5090 Crohn's disease, unspecified, without complications: Secondary | ICD-10-CM | POA: Diagnosis not present

## 2019-09-12 DIAGNOSIS — J029 Acute pharyngitis, unspecified: Secondary | ICD-10-CM | POA: Diagnosis not present

## 2019-10-13 DIAGNOSIS — K50919 Crohn's disease, unspecified, with unspecified complications: Secondary | ICD-10-CM | POA: Diagnosis not present

## 2019-10-13 DIAGNOSIS — M8580 Other specified disorders of bone density and structure, unspecified site: Secondary | ICD-10-CM | POA: Diagnosis not present

## 2019-10-13 DIAGNOSIS — M810 Age-related osteoporosis without current pathological fracture: Secondary | ICD-10-CM | POA: Diagnosis not present

## 2019-10-13 DIAGNOSIS — E559 Vitamin D deficiency, unspecified: Secondary | ICD-10-CM | POA: Diagnosis not present

## 2019-12-17 ENCOUNTER — Other Ambulatory Visit: Payer: Self-pay | Admitting: Physician Assistant

## 2019-12-17 NOTE — Telephone Encounter (Signed)
Compounded refill needed, [facility] METR 2% mixed with HCI % cream

## 2019-12-18 NOTE — Telephone Encounter (Signed)
Left message for patient to let her know we sent in the compound refill to the metronidazole hydrocortisone at [facility]. If she has any questions she can call us back.

## 2020-03-02 DIAGNOSIS — Z8719 Personal history of other diseases of the digestive system: Secondary | ICD-10-CM | POA: Diagnosis not present

## 2020-03-02 DIAGNOSIS — Z862 Personal history of diseases of the blood and blood-forming organs and certain disorders involving the immune mechanism: Secondary | ICD-10-CM | POA: Diagnosis not present

## 2020-03-02 DIAGNOSIS — R718 Other abnormality of red blood cells: Secondary | ICD-10-CM | POA: Diagnosis not present

## 2020-03-09 ENCOUNTER — Telehealth: Payer: Self-pay | Admitting: Dermatology

## 2020-03-09 NOTE — Telephone Encounter (Signed)
Mom called. Claudia Martinez broken out w/eczema around nose + face. Goes back to school Monday. Can anything OTC be recommended? (Has only seen [facility])

## 2020-03-09 NOTE — Telephone Encounter (Signed)
Phone call to patients mom told her she can try to use otc hydrocortisone cream on her face and then if that does not seem to help we can get her in with another provider to look at her face.

## 2020-05-27 DIAGNOSIS — D509 Iron deficiency anemia, unspecified: Secondary | ICD-10-CM | POA: Diagnosis not present

## 2020-05-30 ENCOUNTER — Telehealth: Payer: Self-pay

## 2020-05-30 NOTE — Telephone Encounter (Signed)
Patient would like to [facility] to Dr. Jerline Pain from Dr. Jonni Sanger. Patient states the rest of her family see Dr. Jerline Pain and wants to have the same PCP.

## 2020-05-30 NOTE — Telephone Encounter (Signed)
Ok with me 

## 2020-05-31 NOTE — Telephone Encounter (Signed)
[place] with me.   Algis Greenhouse. Jerline Pain, MD 05/31/2020 10:57 AM

## 2020-05-31 NOTE — Telephone Encounter (Signed)
Talked to patients mother said she will have her call back to schedule.

## 2020-08-26 DIAGNOSIS — K5090 Crohn's disease, unspecified, without complications: Secondary | ICD-10-CM | POA: Diagnosis not present

## 2020-09-08 ENCOUNTER — Encounter: Payer: BC Managed Care – PPO | Admitting: Family Medicine

## 2020-09-16 ENCOUNTER — Telehealth: Payer: Self-pay

## 2020-09-16 ENCOUNTER — Encounter: Payer: Self-pay | Admitting: Family Medicine

## 2020-09-16 ENCOUNTER — Ambulatory Visit (INDEPENDENT_AMBULATORY_CARE_PROVIDER_SITE_OTHER): Payer: BC Managed Care – PPO | Admitting: Family Medicine

## 2020-09-16 ENCOUNTER — Other Ambulatory Visit: Payer: Self-pay

## 2020-09-16 VITALS — BP 110/70 | HR 77 | Temp 97.6°F | Ht 61.0 in | Wt 112.2 lb

## 2020-09-16 DIAGNOSIS — Z13220 Encounter for screening for lipoid disorders: Secondary | ICD-10-CM

## 2020-09-16 DIAGNOSIS — R21 Rash and other nonspecific skin eruption: Secondary | ICD-10-CM

## 2020-09-16 DIAGNOSIS — K5010 Crohn's disease of large intestine without complications: Secondary | ICD-10-CM

## 2020-09-16 DIAGNOSIS — D849 Immunodeficiency, unspecified: Secondary | ICD-10-CM | POA: Diagnosis not present

## 2020-09-16 DIAGNOSIS — Z114 Encounter for screening for human immunodeficiency virus [HIV]: Secondary | ICD-10-CM

## 2020-09-16 DIAGNOSIS — E559 Vitamin D deficiency, unspecified: Secondary | ICD-10-CM | POA: Insufficient documentation

## 2020-09-16 DIAGNOSIS — Z0001 Encounter for general adult medical examination with abnormal findings: Secondary | ICD-10-CM

## 2020-09-16 DIAGNOSIS — Z1159 Encounter for screening for other viral diseases: Secondary | ICD-10-CM | POA: Diagnosis not present

## 2020-09-16 LAB — COMPREHENSIVE METABOLIC PANEL
ALT: 11 U/L (ref 0–35)
AST: 18 U/L (ref 0–37)
Albumin: 4.7 g/dL (ref 3.5–5.2)
Alkaline Phosphatase: 35 U/L — ABNORMAL LOW (ref 39–117)
BUN: 11 mg/dL (ref 6–23)
CO2: 26 mEq/L (ref 19–32)
Calcium: 9.5 mg/dL (ref 8.4–10.5)
Chloride: 104 mEq/L (ref 96–112)
Creatinine, Ser: 0.75 mg/dL (ref 0.40–1.20)
GFR: 113.19 mL/min (ref >60.00)
Glucose, Bld: 82 mg/dL (ref 70–99)
Potassium: 3.7 mEq/L (ref 3.5–5.1)
Sodium: 139 mEq/L (ref 135–145)
Total Bilirubin: 0.7 mg/dL (ref 0.2–1.2)
Total Protein: 7.6 g/dL (ref 6.0–8.3)

## 2020-09-16 LAB — LIPID PANEL
Cholesterol: 148 mg/dL (ref 0–200)
HDL: 68 mg/dL (ref >39.00)
LDL Cholesterol: 74 mg/dL (ref 0–99)
NonHDL: 79.85
Total CHOL/HDL Ratio: 2
Triglycerides: 29 mg/dL (ref 0.0–149.0)
VLDL: 5.8 mg/dL (ref 0.0–40.0)

## 2020-09-16 LAB — CBC WITH DIFFERENTIAL/PLATELET
Basophils Absolute: 0 10*3/uL (ref 0.0–0.1)
Basophils Relative: 0.5 % (ref 0.0–3.0)
Eosinophils Absolute: 0.1 10*3/uL (ref 0.0–0.7)
Eosinophils Relative: 2.8 % (ref 0.0–5.0)
HCT: 35.9 % — ABNORMAL LOW (ref 36.0–46.0)
Hemoglobin: 11.9 g/dL — ABNORMAL LOW (ref 12.0–15.0)
Lymphocytes Relative: 43.6 % (ref 12.0–46.0)
Lymphs Abs: 1.6 10*3/uL (ref 0.7–4.0)
MCHC: 33.1 g/dL (ref 30.0–36.0)
MCV: 102.4 fl — ABNORMAL HIGH (ref 78.0–100.0)
Monocytes Absolute: 0.3 10*3/uL (ref 0.1–1.0)
Monocytes Relative: 9.1 % (ref 3.0–12.0)
Neutro Abs: 1.6 10*3/uL (ref 1.4–7.7)
Neutrophils Relative %: 44 % (ref 43.0–77.0)
Platelets: 195 10*3/uL (ref 150.0–400.0)
RBC: 3.51 Mil/uL — ABNORMAL LOW (ref 3.87–5.11)
RDW: 14 % (ref 11.5–15.5)
WBC: 3.6 10*3/uL — ABNORMAL LOW (ref 4.0–10.5)

## 2020-09-16 LAB — VITAMIN D 25 HYDROXY (VIT D DEFICIENCY, FRACTURES): VITD: 52.88 ng/mL (ref 30.00–100.00)

## 2020-09-16 MED ORDER — ADAPALENE 0.3 % EX GEL: CUTANEOUS | 1 refills | Status: DC

## 2020-09-16 NOTE — Patient Instructions (Signed)
It was very nice to see you today!  We will check blood work today.  Please try the Differin cream for the rash on your face.  Please continue the good work with your diet and exercise.  I will see you back in year for your next annual checkup.  Please come back to see [place] sooner if needed.  Take care, Dr Jerline Pain  PLEASE NOTE:  If you had any lab tests please let us know if you have not heard back within a few days. You may see your results on mychart before we have a chance to review them but we will give you a call once they are reviewed by [place]. If we ordered any referrals today, please let us know if you have not heard from their office within the next week.   Please try these tips to maintain a healthy lifestyle:  Eat at least 3 REAL meals and 1-2 snacks per day.  Aim for no more than 5 hours between eating.  If you eat breakfast, please do so within one hour of getting up.   Each meal should contain half fruits/vegetables, one quarter protein, and one quarter carbs (no bigger than a computer mouse)  Cut down on sweet beverages. This includes juice, soda, and sweet tea.   Drink at least 1 glass of water with each meal and aim for at least 8 glasses per day  Exercise at least 150 minutes every week.    Preventive Care 82-10 Years Old, Female Preventive care refers to lifestyle choices and visits with your health care provider that can promote health and wellness. This includes: A yearly physical exam. This is also called an annual wellness visit. Regular dental and eye exams. Immunizations. Screening for certain conditions. Healthy lifestyle choices, such as: Eating a healthy diet. Getting regular exercise. Not using drugs or products that contain nicotine and tobacco. Limiting alcohol use. What can I expect for my preventive care visit? Physical exam Your health care provider may check your: Height and weight. These may be used to calculate your BMI (body mass index). BMI  is a measurement that tells if you are at a healthy weight. Heart rate and blood pressure. Body temperature. Skin for abnormal spots. Counseling Your health care provider may ask you questions about your: Past medical problems. Family's medical history. Alcohol, tobacco, and drug use. Emotional well-being. Home life and relationship well-being. Sexual activity. Diet, exercise, and sleep habits. Work and work Statistician. Access to firearms. Method of birth control. Menstrual cycle. Pregnancy history. What immunizations do I need?  Vaccines are usually given at various ages, according to a schedule. Your health care provider will recommend vaccines for you based on your age, medicalhistory, and lifestyle or other factors, such as travel or where you work. What tests do I need?  Blood tests Lipid and cholesterol levels. These may be checked every 5 years starting at age 12. Hepatitis C test. Hepatitis B test. Screening Diabetes screening. This is done by checking your blood sugar (glucose) after you have not eaten for a while (fasting). STD (sexually transmitted disease) testing, if you are at risk. BRCA-related cancer screening. This may be done if you have a family history of breast, ovarian, tubal, or peritoneal cancers. Pelvic exam and Pap test. This may be done every 3 years starting at age 70. Starting at age 59, this may be done every 5 years if you have a Pap test in combination with an HPV test. Talk with your  health care provider about your test results, treatment options,and if necessary, the need for more tests. Follow these instructions at home: Eating and drinking  Eat a healthy diet that includes fresh fruits and vegetables, whole grains, lean protein, and low-fat dairy products. Take vitamin and mineral supplements as recommended by your health care provider. Do not drink alcohol if: Your health care provider tells you not to drink. You are pregnant, may be  pregnant, or are planning to become pregnant. If you drink alcohol: Limit how much you have to 0-1 drink a day. Be aware of how much alcohol is in your drink. In the [place] one drink equals one 12 oz bottle of beer (355 mL), one 5 oz glass of wine (148 mL), or one 1 oz glass of hard liquor (44 mL).  Lifestyle Take daily care of your teeth and gums. Brush your teeth every morning and night with fluoride toothpaste. Floss one time each day. Stay active. Exercise for at least 30 minutes 5 or more days each week. Do not use any products that contain nicotine or tobacco, such as cigarettes, e-cigarettes, and chewing tobacco. If you need help quitting, ask your health care provider. Do not use drugs. If you are sexually active, practice safe sex. Use a condom or other form of protection to prevent STIs (sexually transmitted infections). If you do not wish to become pregnant, use a form of birth control. If you plan to become pregnant, see your health care provider for a prepregnancy visit. Find healthy ways to cope with stress, such as: Meditation, yoga, or listening to music. Journaling. Talking to a trusted person. Spending time with friends and family. Safety Always wear your seat belt while driving or riding in a vehicle. Do not drive: If you have been drinking alcohol. Do not ride with someone who has been drinking. When you are tired or distracted. While texting. Wear a helmet and other protective equipment during sports activities. If you have firearms in your house, make sure you follow all gun safety procedures. Seek help if you have been physically or sexually abused. What's next? Go to your health care provider once a year for an annual wellness visit. Ask your health care provider how often you should have your eyes and teeth checked. Stay up to date on all vaccines. This information is not intended to replace advice given to you by your health care provider. Make sure you discuss  any questions you have with your healthcare provider. Document Revised: 10/25/2019 Document Reviewed: 11/07/2017 Elsevier Patient Education  2022 [employer].

## 2020-09-16 NOTE — Telephone Encounter (Signed)
Patients mother called in with Patients Covid Vaccine dates  Claudia Martinez - 1st shot - 05/21/19 Moderna - 2nd shot - 06/21/20 Moderna Booster - 01/30/20 Moderna 2nd booster 08/22/20

## 2020-09-16 NOTE — Assessment & Plan Note (Signed)
Also with GI.  She is currently on Pentasa and Imuran.  Her Crohn's disease is in remission.

## 2020-09-16 NOTE — Progress Notes (Signed)
Chief Complaint:  Claudia Martinez is a 20 y.o. female who presents today for her annual comprehensive physical exam.    Assessment/Plan:  New/Acute Problems: Rash Acne versus milia.  Start Differin.  She will follow-up with dermatology if not improving.  Chronic Problems Addressed Today: Crohn's disease of large intestine without complication ([place]) Also with GI.  She is currently on Pentasa and Imuran.  Her Crohn's disease is in remission.  Vitamin D deficiency Check vitamin D.  Preventative Healthcare: Follows with GYN but was not yet started Paps as she is not sexually active.  We will check labs today.  She is up-to-date on vaccines.  Patient Counseling(The following topics were reviewed and/or handout was given):  -Nutrition: Stressed importance of moderation in sodium/caffeine intake, saturated fat and cholesterol, caloric balance, sufficient intake of fresh fruits, vegetables, and fiber.  -Stressed the importance of regular exercise.   -Substance Abuse: Discussed cessation/primary prevention of tobacco, alcohol, or other drug use; driving or other dangerous activities under the influence; availability of treatment for abuse.   -Injury prevention: Discussed safety belts, safety helmets, smoke detector, smoking near bedding or upholstery.   -Sexuality: Discussed sexually transmitted diseases, partner selection, use of condoms, avoidance of unintended pregnancy and contraceptive alternatives.   -Dental health: Discussed importance of regular tooth brushing, flossing, and dental visits.  -Health maintenance and immunizations reviewed. Please refer to Health maintenance section.  Return to care in 1 year for next preventative visit.     Subjective:  HPI:  She has no acute complaints today.   Expresses a complaint about rash breakout on her forehead, which she will be seeing a dermatologist for. Has been present for 4-5 months, and over the counter medications she has tried to  treat it have not been successful.   Her original Crohn's Disease diagnosis was when she was 80. Since the original treatment given after the diagnosis, she has been handling the condition well with no significant issues stated. She gets colonoscopies every 2 years, and takes supplements to alleviate deficiencies caused by Crohn's.  She feels that she has a reasonably healthy diet, avoids greasy foods to avoid irritation to her Crohn's. Exercises reasonably.  Lifestyle Diet: Balanced. Somewhat limited due to Crohn's disease. Exercise: Regular.  Depression screen PHQ 2/9 09/16/2020  Decreased Interest 0  Down, Depressed, Hopeless 0  PHQ - 2 Score 0    Health Maintenance Due  Topic Date Due   Pneumococcal Vaccine 9-12 Years old (1 - PPSV23 or PCV20) 10/27/2014   Hepatitis C Screening  Never done     ROS: Per HPI, otherwise a complete review of systems was negative.   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 No pertinent surgical history.  History reviewed. No pertinent family history.  Medications- reviewed and updated Current Outpatient Medications  Medication Sig Dispense Refill   Adapalene 0.3 % gel Apply to affected area nightly. 45 g 1   Ascorbic Acid (VITAMIN C) 500 MG CHEW Chew 1 each by mouth daily in the afternoon.     azaTHIOprine (IMURAN) 50 MG tablet Take 1 tablet by mouth  2 (two) times daily.     [facility] VITAMIN B12 1000 MCG tablet Take 1,000 mcg by mouth daily.     ferrous sulfate 325 (65 FE) MG tablet Iron (ferrous sulfate) 325 mg (65 mg iron) tablet  Take 1 tablet  every day by oral route.     folic acid (FOLVITE) 1 MG tablet Take 1 mg by mouth daily.     Multiple Vitamin (MULTIVITAMIN) tablet Take 1 tablet by mouth daily.     PENTASA 500 MG CR capsule Take 3 capsules by mouth 2 (two) times daily.     No current facility-administered medications for this visit.    Allergies-reviewed and updated Allergies  Allergen Reactions   Other     Social History   Socioeconomic History   Marital status: Single    Spouse name: Not on file   Number of children: Not on file   Years of education: Not on file   Highest education level: Not on file  Occupational History   Occupation: Ship broker; online, college   Occupation: Scientist, clinical (histocompatibility and immunogenetics) at [employer]  Tobacco Use   Smoking status: Never   Smokeless tobacco: Never  Vaping Use   Vaping Use: Never used  Substance and Sexual Activity   Alcohol use: Never   Drug use: Never   Sexual activity: Never  Other Topics Concern   Not on file  Social History Narrative   Lives with parents; originally from [place]   Has 2 older siblings.    Social Determinants of Health   Financial Resource Strain: Not on file  Food Insecurity: Not on file  Transportation Needs: Not on file  Physical Activity: Not on file  Stress: Not on file  Social Connections: Not on file        Objective:  Physical Exam: BP 110/70 (BP Location: Left Arm, Patient Position: Sitting, Cuff Size: Normal)   Pulse 77   Temp 97.6 F (36.4 C) (Temporal)   Ht 5' 1"  (1.549 m)   Wt 112 lb 4 oz (50.9 kg)   LMP 09/06/2020 (Exact Date)   BMI 21.21 kg/m   Body mass index is 21.21 kg/m. Wt Readings from Last 3 Encounters:  09/16/20 112 lb 4 oz (50.9 kg)  11/07/18 105 lb (47.6 kg)  11/06/18 109 lb (49.4 kg)   Gen: NAD, resting comfortably HEENT: TMs normal bilaterally. OP clear. No thyromegaly noted.  CV: RRR with no murmurs appreciated Pulm: NWOB, CTAB with no crackles, wheezes, or rhonchi GI: Normal bowel sounds present. Soft, Nontender,  Nondistended. MSK: no edema, cyanosis, or clubbing noted Skin: warm, dry.  Fine papular rash on central forehead with mild amount of surrounding erythema. Neuro: CN2-12 grossly intact. Strength 5/5 in upper and lower extremities. Reflexes symmetric and intact bilaterally.  Psych: Normal affect and thought content     Alphonsus Doyel M. Jerline Pain, MD 09/16/2020 10:18 AM

## 2020-09-16 NOTE — Telephone Encounter (Signed)
Vaccines updated

## 2020-09-16 NOTE — Assessment & Plan Note (Signed)
Check vitamin D. 

## 2020-09-19 LAB — HIV ANTIBODY (ROUTINE TESTING W REFLEX): HIV 1&2 Ab, 4th Generation: NONREACTIVE

## 2020-09-19 LAB — HEPATITIS C ANTIBODY
Hepatitis C Ab: NONREACTIVE
SIGNAL TO CUT-OFF: 0.06 (ref <1.00)

## 2020-09-19 NOTE — Progress Notes (Signed)
Please inform patient of the following:  Labs are all STABLE. Would like for her to keep up the good work and we can recheck in a few years.  Algis Greenhouse. Jerline Pain, MD 09/19/2020 3:40 PM

## 2020-09-21 ENCOUNTER — Telehealth: Payer: Self-pay

## 2020-09-21 NOTE — Telephone Encounter (Signed)
Patient's mother called back for lab results

## 2020-09-21 NOTE — Telephone Encounter (Signed)
Lm for pt mom tcb.

## 2020-09-21 NOTE — Telephone Encounter (Signed)
Called and spoke with pt mom and lab results given.

## 2020-10-06 DIAGNOSIS — D7589 Other specified diseases of blood and blood-forming organs: Secondary | ICD-10-CM | POA: Diagnosis not present

## 2020-10-06 DIAGNOSIS — Z8719 Personal history of other diseases of the digestive system: Secondary | ICD-10-CM | POA: Diagnosis not present

## 2020-10-11 ENCOUNTER — Ambulatory Visit (INDEPENDENT_AMBULATORY_CARE_PROVIDER_SITE_OTHER): Payer: BC Managed Care – PPO | Admitting: Dermatology

## 2020-10-11 ENCOUNTER — Other Ambulatory Visit: Payer: Self-pay

## 2020-10-11 DIAGNOSIS — L700 Acne vulgaris: Secondary | ICD-10-CM

## 2020-10-11 MED ORDER — ARAZLO 0.045 % EX LOTN: 1.0000 "application " | TOPICAL_LOTION | Freq: Every evening | CUTANEOUS | 2 refills | Status: DC

## 2020-10-11 NOTE — Patient Instructions (Signed)
[place] @ [phone] if you do not hear from them by the end of the day today.

## 2020-10-30 ENCOUNTER — Encounter: Payer: Self-pay | Admitting: Dermatology

## 2020-10-30 NOTE — Progress Notes (Signed)
   Follow-Up Visit   Subjective  Claudia Martinez is a 20 y.o. female who presents for the following: Skin Problem (Here for breakout on forehead. X months. Patient has tried cortisone, betamethasone and adapalene. Nothing seems to make it go away. No itch or anything. ).  Breaking out for head Location:  Duration:  Quality:  Associated Signs/Symptoms: Modifying Factors:  Severity:  Timing: Context:   Objective  Well appearing patient in no apparent distress; mood and affect are within normal limits. Mid Forehead In addition to endogenous aggravating there is a possibility of a component of steroid-induced maintaining his of her Crohn's therapy.  Mostly superficial inflammatory papules.    A focused examination was performed including head and neck. Relevant physical exam findings are noted in the Assessment and Plan.   Assessment & Plan    Acne vulgaris Mid Forehead  Arazlo foam initially 3 times weekly to areas prone to breakout.  Follow-up via phone in 2 months.  Related Medications Tazarotene (ARAZLO) 0.045 % LOTN Apply 1 application topically at bedtime.      I, Lavonna Monarch, MD, have reviewed all documentation for this visit.  The documentation on 10/30/20 for the exam, diagnosis, procedures, and orders are all accurate and complete.

## 2021-02-07 ENCOUNTER — Ambulatory Visit: Payer: BC Managed Care – PPO | Admitting: Dermatology

## 2021-02-08 DIAGNOSIS — Z3040 Encounter for surveillance of contraceptives, unspecified: Secondary | ICD-10-CM | POA: Diagnosis not present

## 2021-02-08 DIAGNOSIS — Z01419 Encounter for gynecological examination (general) (routine) without abnormal findings: Secondary | ICD-10-CM | POA: Diagnosis not present

## 2021-02-08 DIAGNOSIS — K5090 Crohn's disease, unspecified, without complications: Secondary | ICD-10-CM | POA: Diagnosis not present

## 2021-02-08 DIAGNOSIS — M810 Age-related osteoporosis without current pathological fracture: Secondary | ICD-10-CM | POA: Diagnosis not present

## 2021-02-08 DIAGNOSIS — Z6821 Body mass index (BMI) 21.0-21.9, adult: Secondary | ICD-10-CM | POA: Diagnosis not present

## 2021-02-13 DIAGNOSIS — K5090 Crohn's disease, unspecified, without complications: Secondary | ICD-10-CM | POA: Diagnosis not present

## 2021-08-22 DIAGNOSIS — Z8719 Personal history of other diseases of the digestive system: Secondary | ICD-10-CM | POA: Diagnosis not present

## 2021-08-22 DIAGNOSIS — R718 Other abnormality of red blood cells: Secondary | ICD-10-CM | POA: Diagnosis not present

## 2021-08-22 DIAGNOSIS — K5090 Crohn's disease, unspecified, without complications: Secondary | ICD-10-CM | POA: Diagnosis not present

## 2021-11-28 ENCOUNTER — Encounter: Payer: Self-pay | Admitting: Family Medicine

## 2021-11-28 ENCOUNTER — Telehealth: Payer: Self-pay | Admitting: Family Medicine

## 2021-11-28 ENCOUNTER — Ambulatory Visit (INDEPENDENT_AMBULATORY_CARE_PROVIDER_SITE_OTHER): Payer: BC Managed Care – PPO | Admitting: Family Medicine

## 2021-11-28 VITALS — BP 99/64 | HR 93 | Temp 98.6°F | Ht 61.0 in | Wt 108.8 lb

## 2021-11-28 DIAGNOSIS — R5383 Other fatigue: Secondary | ICD-10-CM

## 2021-11-28 DIAGNOSIS — E559 Vitamin D deficiency, unspecified: Secondary | ICD-10-CM | POA: Diagnosis not present

## 2021-11-28 DIAGNOSIS — D849 Immunodeficiency, unspecified: Secondary | ICD-10-CM

## 2021-11-28 DIAGNOSIS — K5010 Crohn's disease of large intestine without complications: Secondary | ICD-10-CM

## 2021-11-28 DIAGNOSIS — Z0001 Encounter for general adult medical examination with abnormal findings: Secondary | ICD-10-CM | POA: Diagnosis not present

## 2021-11-28 NOTE — Patient Instructions (Signed)
It was very nice to see you today!  We will check blood work today.  Please continue the good work with your diet and exercise.  I will see back in year for your next physical.  Please come back to see [place] sooner if needed.  Take care, Dr Jerline Pain  PLEASE NOTE:  If you had any lab tests please let us know if you have not heard back within a few days. You may see your results on mychart before we have a chance to review them but we will give you a call once they are reviewed by [place]. If we ordered any referrals today, please let us know if you have not heard from their office within the next week.   Please try these tips to maintain a healthy lifestyle:  Eat at least 3 REAL meals and 1-2 snacks per day.  Aim for no more than 5 hours between eating.  If you eat breakfast, please do so within one hour of getting up.   Each meal should contain half fruits/vegetables, one quarter protein, and one quarter carbs (no bigger than a computer mouse)  Cut down on sweet beverages. This includes juice, soda, and sweet tea.   Drink at least 1 glass of water with each meal and aim for at least 8 glasses per day  Exercise at least 150 minutes every week.    Preventive Care 10-64 Years Old, Female Preventive care refers to lifestyle choices and visits with your health care provider that can promote health and wellness. Preventive care visits are also called wellness exams. What can I expect for my preventive care visit? Counseling During your preventive care visit, your health care provider may ask about your: Medical history, including: Past medical problems. Family medical history. Pregnancy history. Current health, including: Menstrual cycle. Method of birth control. Emotional well-being. Home life and relationship well-being. Sexual activity and sexual health. Lifestyle, including: Alcohol, nicotine or tobacco, and drug use. Access to firearms. Diet, exercise, and sleep habits. Work and  work Statistician. Sunscreen use. Safety issues such as seatbelt and bike helmet use. Physical exam Your health care provider may check your: Height and weight. These may be used to calculate your BMI (body mass index). BMI is a measurement that tells if you are at a healthy weight. Waist circumference. This measures the distance around your waistline. This measurement also tells if you are at a healthy weight and may help predict your risk of certain diseases, such as type 2 diabetes and high blood pressure. Heart rate and blood pressure. Body temperature. Skin for abnormal spots. What immunizations do I need?  Vaccines are usually given at various ages, according to a schedule. Your health care provider will recommend vaccines for you based on your age, medical history, and lifestyle or other factors, such as travel or where you work. What tests do I need? Screening Your health care provider may recommend screening tests for certain conditions. This may include: Pelvic exam and Pap test. Lipid and cholesterol levels. Diabetes screening. This is done by checking your blood sugar (glucose) after you have not eaten for a while (fasting). Hepatitis B test. Hepatitis C test. HIV (human immunodeficiency virus) test. STI (sexually transmitted infection) testing, if you are at risk. BRCA-related cancer screening. This may be done if you have a family history of breast, ovarian, tubal, or peritoneal cancers. Talk with your health care provider about your test results, treatment options, and if necessary, the need for more tests. Follow  these instructions at home: Eating and drinking  Eat a healthy diet that includes fresh fruits and vegetables, whole grains, lean protein, and low-fat dairy products. Take vitamin and mineral supplements as recommended by your health care provider. Do not drink alcohol if: Your health care provider tells you not to drink. You are pregnant, may be pregnant, or  are planning to become pregnant. If you drink alcohol: Limit how much you have to 0-1 drink a day. Know how much alcohol is in your drink. In the [place] one drink equals one 12 oz bottle of beer (355 mL), one 5 oz glass of wine (148 mL), or one 1 oz glass of hard liquor (44 mL). Lifestyle Brush your teeth every morning and night with fluoride toothpaste. Floss one time each day. Exercise for at least 30 minutes 5 or more days each week. Do not use any products that contain nicotine or tobacco. These products include cigarettes, chewing tobacco, and vaping devices, such as e-cigarettes. If you need help quitting, ask your health care provider. Do not use drugs. If you are sexually active, practice safe sex. Use a condom or other form of protection to prevent STIs. If you do not wish to become pregnant, use a form of birth control. If you plan to become pregnant, see your health care provider for a prepregnancy visit. Find healthy ways to manage stress, such as: Meditation, yoga, or listening to music. Journaling. Talking to a trusted person. Spending time with friends and family. Minimize exposure to UV radiation to reduce your risk of skin cancer. Safety Always wear your seat belt while driving or riding in a vehicle. Do not drive: If you have been drinking alcohol. Do not ride with someone who has been drinking. If you have been using any mind-altering substances or drugs. While texting. When you are tired or distracted. Wear a helmet and other protective equipment during sports activities. If you have firearms in your house, make sure you follow all gun safety procedures. Seek help if you have been physically or sexually abused. What's next? Go to your health care provider once a year for an annual wellness visit. Ask your health care provider how often you should have your eyes and teeth checked. Stay up to date on all vaccines. This information is not intended to replace advice  given to you by your health care provider. Make sure you discuss any questions you have with your health care provider. Document Revised: 08/24/2020 Document Reviewed: 08/24/2020 Elsevier Patient Education  [place].

## 2021-11-28 NOTE — Progress Notes (Signed)
Chief Complaint:  Claudia Martinez is a 21 y.o. female who presents today for her annual comprehensive physical exam.    Assessment/Plan:  New/Acute Problems: Other Fatigue We will check requested labs today.  Check CBC, c-Met, TSH, B12, iron panel, and vitamin D.  Chronic Problems Addressed Today: Vitamin D deficiency Check vitamin D.  Crohn's disease of large intestine without complication ([place]) Stable.  No recent flares.  Continue management per GI.   Preventative Healthcare: Check labs.  Up-to-date on flu vaccine.  Follows with gynecology for cervical cancer screening.  Patient Counseling(The following topics were reviewed and/or handout was given):  -Nutrition: Stressed importance of moderation in sodium/caffeine intake, saturated fat and cholesterol, caloric balance, sufficient intake of fresh fruits, vegetables, and fiber.  -Stressed the importance of regular exercise.   -Substance Abuse: Discussed cessation/primary prevention of tobacco, alcohol, or other drug use; driving or other dangerous activities under the influence; availability of treatment for abuse.   -Injury prevention: Discussed safety belts, safety helmets, smoke detector, smoking near bedding or upholstery.   -Sexuality: Discussed sexually transmitted diseases, partner selection, use of condoms, avoidance of unintended pregnancy and contraceptive alternatives.   -Dental health: Discussed importance of regular tooth brushing, flossing, and dental visits.  -Health maintenance and immunizations reviewed. Please refer to Health maintenance section.  Return to care in 1 year for next preventative visit.     Subjective:  HPI:  She has no acute complaints today.   Lifestyle Diet: Balanced. Plenty of fruits and vegetables.  Exercise: Goes on walks routtinely.      11/28/2021    1:43 PM  Depression screen PHQ 2/9  Decreased Interest 0  Down, Depressed, Hopeless 0  PHQ - 2 Score 0    Health Maintenance  Due  Topic Date Due   PAP-Cervical Cytology Screening  Never done   PAP SMEAR-Modifier  Never done     ROS: Per HPI, otherwise a complete review of systems was negative.   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 No pertinent surgical history.  History reviewed. No pertinent family history.  Medications- reviewed and updated Current Outpatient Medications  Medication Sig Dispense Refill   Ascorbic Acid (VITAMIN C) 500 MG CHEW Chew 1 each by mouth daily in the afternoon.     azaTHIOprine (IMURAN) 50 MG tablet Take 1 tablet by mouth 2 (two) times daily.     [facility] VITAMIN B12 1000 MCG tablet Take 1,000 mcg by mouth daily.     ferrous sulfate 325 (65 FE) MG tablet Iron (ferrous sulfate) 325 mg (65 mg iron) tablet  Take 1 tablet every day by oral route.     folic acid (FOLVITE) 1 MG tablet Take 1 mg by mouth daily.     Multiple Vitamin (MULTIVITAMIN) tablet Take 1 tablet by mouth daily.     PENTASA 500 MG CR capsule Take 3 capsules by mouth 2 (two) times daily.     No current facility-administered medications for this visit.    Allergies-reviewed and updated Allergies  Allergen Reactions   Other     Social History   Socioeconomic  History   Marital status: Single    Spouse name: Not on file   Number of children: Not on file   Years of education: Not on file   Highest education level: Not on file  Occupational History   Occupation: Ship broker; online, college   Occupation: Clerk at [employer]  Tobacco Use   Smoking status: Never   Smokeless tobacco: Never  Vaping Use    Vaping Use: Never used  Substance and Sexual Activity   Alcohol use: Never   Drug use: Never   Sexual activity: Never  Other Topics Concern   Not on file  Social History Narrative   Lives with parents; originally from [place]   Has 2 older siblings.    Social Determinants of Health   Financial Resource Strain: Not on file  Food Insecurity: Not on file  Transportation Needs: Not on file  Physical Activity: Not on file  Stress: Not on file  Social Connections: Not on file        Objective:  Physical Exam: BP 99/64   Pulse 93   Temp 98.6 F (37 C) (Temporal)   Ht 5' 1"  (1.549 m)   Wt 108 lb 12.8 oz (49.4 kg)   LMP 11/15/2021   SpO2 99%   BMI 20.56 kg/m   Body mass index is 20.56 kg/m. Wt Readings from Last 3 Encounters:  11/28/21 108 lb 12.8 oz (49.4 kg)  09/16/20 112 lb 4 oz (50.9 kg)  11/07/18 105 lb (47.6 kg)   Gen: NAD, resting comfortably HEENT: TMs normal bilaterally. OP clear. No thyromegaly noted.  CV: RRR with no murmurs appreciated Pulm: NWOB, CTAB with no crackles, wheezes, or rhonchi GI: Normal bowel sounds present. Soft, Nontender, Nondistended. MSK: no edema, cyanosis, or clubbing noted Skin: warm, dry Neuro: CN2-12 grossly intact. Strength 5/5 in upper and lower extremities. Reflexes symmetric and intact bilaterally.  Psych: Normal affect and thought content     Aroldo Galli M. Jerline Pain, MD 11/28/2021 2:09 PM

## 2021-11-28 NOTE — Assessment & Plan Note (Signed)
Stable.  No recent flares.  Continue management per GI.

## 2021-11-28 NOTE — Assessment & Plan Note (Signed)
Check vitamin D. 

## 2021-11-28 NOTE — Telephone Encounter (Signed)
Patient needs to come back to have blood work redrawn [facility] [employer] lab dropped the tube she suppose to come back in the morning.

## 2021-11-29 ENCOUNTER — Other Ambulatory Visit: Payer: BC Managed Care – PPO

## 2021-11-29 LAB — CBC
HCT: 36.4 % (ref 36.0–46.0)
Hemoglobin: 12.3 g/dL (ref 12.0–15.0)
MCHC: 33.8 g/dL (ref 30.0–36.0)
MCV: 101.8 fl — ABNORMAL HIGH (ref 78.0–100.0)
Platelets: 239 10*3/uL (ref 150.0–400.0)
RBC: 3.58 Mil/uL — ABNORMAL LOW (ref 3.87–5.11)
RDW: 14.8 % (ref 11.5–15.5)
WBC: 3.8 10*3/uL — ABNORMAL LOW (ref 4.0–10.5)

## 2021-11-29 LAB — IBC + FERRITIN
Ferritin: 41 ng/mL (ref 10.0–291.0)
Iron: 97 ug/dL (ref 42–145)
Saturation Ratios: 37.3 % (ref 20.0–50.0)
TIBC: 260.4 ug/dL (ref 250.0–450.0)
Transferrin: 186 mg/dL — ABNORMAL LOW (ref 212.0–360.0)

## 2021-11-29 LAB — VITAMIN D 25 HYDROXY (VIT D DEFICIENCY, FRACTURES): VITD: 32.7 ng/mL (ref 30.00–100.00)

## 2021-11-29 LAB — TSH: TSH: 3.61 u[IU]/mL (ref 0.35–5.50)

## 2021-11-29 LAB — COMPREHENSIVE METABOLIC PANEL
ALT: 6 U/L (ref 0–35)
AST: 15 U/L (ref 0–37)
Albumin: 4.3 g/dL (ref 3.5–5.2)
Alkaline Phosphatase: 40 U/L (ref 39–117)
BUN: 10 mg/dL (ref 6–23)
CO2: 25 mEq/L (ref 19–32)
Calcium: 9.5 mg/dL (ref 8.4–10.5)
Chloride: 104 mEq/L (ref 96–112)
Creatinine, Ser: 0.74 mg/dL (ref 0.40–1.20)
GFR: 114.06 mL/min (ref >60.00)
Glucose, Bld: 88 mg/dL (ref 70–99)
Potassium: 3.2 mEq/L — ABNORMAL LOW (ref 3.5–5.1)
Sodium: 139 mEq/L (ref 135–145)
Total Bilirubin: 0.4 mg/dL (ref 0.2–1.2)
Total Protein: 7.5 g/dL (ref 6.0–8.3)

## 2021-11-29 LAB — VITAMIN B12: Vitamin B-12: >1500 pg/mL — ABNORMAL HIGH (ref 211–911)

## 2021-11-29 NOTE — Addendum Note (Signed)
Addended by: Loura Back on: 11/29/2021 09:30 AM   Modules accepted: Orders

## 2021-11-29 NOTE — Telephone Encounter (Signed)
Please make sure patient is aware.  Claudia Martinez. Jerline Pain, MD 11/29/2021 7:29 AM

## 2021-11-29 NOTE — Telephone Encounter (Signed)
Patient had labs redrawn in office this morning

## 2021-11-30 NOTE — Progress Notes (Signed)
Please inform patient of the following:  Her potassium is little low but all of her other levels are stable.  She can try taking a potassium supplement over-the-counter potassium chloride 10 mill equivalents daily or she can try eating foods rich in potassium such as bananas and other fruits.  We should recheck again in a few weeks.  Please place future order for BMI.

## 2021-12-04 ENCOUNTER — Other Ambulatory Visit: Payer: Self-pay | Admitting: *Deleted

## 2022-01-05 DIAGNOSIS — K5090 Crohn's disease, unspecified, without complications: Secondary | ICD-10-CM | POA: Diagnosis not present

## 2022-01-08 DIAGNOSIS — K5090 Crohn's disease, unspecified, without complications: Secondary | ICD-10-CM | POA: Diagnosis not present

## 2022-01-17 DIAGNOSIS — L723 Sebaceous cyst: Secondary | ICD-10-CM | POA: Diagnosis not present

## 2022-01-17 DIAGNOSIS — K5010 Crohn's disease of large intestine without complications: Secondary | ICD-10-CM | POA: Diagnosis not present

## 2022-02-12 DIAGNOSIS — Z113 Encounter for screening for infections with a predominantly sexual mode of transmission: Secondary | ICD-10-CM | POA: Diagnosis not present

## 2022-02-12 DIAGNOSIS — Z01419 Encounter for gynecological examination (general) (routine) without abnormal findings: Secondary | ICD-10-CM | POA: Diagnosis not present

## 2022-02-12 DIAGNOSIS — Z6822 Body mass index (BMI) 22.0-22.9, adult: Secondary | ICD-10-CM | POA: Diagnosis not present

## 2022-02-12 DIAGNOSIS — Z124 Encounter for screening for malignant neoplasm of cervix: Secondary | ICD-10-CM | POA: Diagnosis not present

## 2022-02-20 LAB — HM PAP SMEAR

## 2022-05-30 DIAGNOSIS — K5090 Crohn's disease, unspecified, without complications: Secondary | ICD-10-CM | POA: Diagnosis not present

## 2022-05-30 DIAGNOSIS — K5010 Crohn's disease of large intestine without complications: Secondary | ICD-10-CM | POA: Diagnosis not present

## 2022-05-30 DIAGNOSIS — D509 Iron deficiency anemia, unspecified: Secondary | ICD-10-CM | POA: Diagnosis not present

## 2022-05-30 DIAGNOSIS — E559 Vitamin D deficiency, unspecified: Secondary | ICD-10-CM | POA: Diagnosis not present

## 2022-05-31 DIAGNOSIS — K5010 Crohn's disease of large intestine without complications: Secondary | ICD-10-CM | POA: Diagnosis not present

## 2022-06-02 DIAGNOSIS — H00014 Hordeolum externum left upper eyelid: Secondary | ICD-10-CM | POA: Diagnosis not present

## 2022-10-22 ENCOUNTER — Other Ambulatory Visit: Payer: Self-pay | Admitting: Obstetrics and Gynecology

## 2022-10-22 DIAGNOSIS — M810 Age-related osteoporosis without current pathological fracture: Secondary | ICD-10-CM

## 2022-10-27 ENCOUNTER — Emergency Department (HOSPITAL_COMMUNITY)
Admission: EM | Admit: 2022-10-27 | Discharge: 2022-10-27 | Disposition: A | Discharge status: Home / Self Care | Payer: BC Managed Care – PPO | Attending: Emergency Medicine | Admitting: Emergency Medicine

## 2022-10-27 ENCOUNTER — Encounter (HOSPITAL_COMMUNITY): Payer: Self-pay | Admitting: Emergency Medicine

## 2022-10-27 ENCOUNTER — Other Ambulatory Visit: Payer: Self-pay

## 2022-10-27 ENCOUNTER — Emergency Department (HOSPITAL_COMMUNITY): Payer: BC Managed Care – PPO

## 2022-10-27 DIAGNOSIS — E871 Hypo-osmolality and hyponatremia: Secondary | ICD-10-CM | POA: Insufficient documentation

## 2022-10-27 DIAGNOSIS — K50111 Crohn's disease of large intestine with rectal bleeding: Secondary | ICD-10-CM

## 2022-10-27 DIAGNOSIS — R109 Unspecified abdominal pain: Secondary | ICD-10-CM | POA: Diagnosis not present

## 2022-10-27 DIAGNOSIS — K529 Noninfective gastroenteritis and colitis, unspecified: Secondary | ICD-10-CM

## 2022-10-27 DIAGNOSIS — K5090 Crohn's disease, unspecified, without complications: Secondary | ICD-10-CM | POA: Insufficient documentation

## 2022-10-27 DIAGNOSIS — D649 Anemia, unspecified: Secondary | ICD-10-CM | POA: Insufficient documentation

## 2022-10-27 DIAGNOSIS — N83291 Other ovarian cyst, right side: Secondary | ICD-10-CM | POA: Diagnosis not present

## 2022-10-27 LAB — COMPREHENSIVE METABOLIC PANEL
ALT: 12 U/L (ref 0–44)
AST: 20 U/L (ref 15–41)
Albumin: 4.5 g/dL (ref 3.5–5.0)
Alkaline Phosphatase: 41 U/L (ref 38–126)
Anion gap: 9 (ref 5–15)
BUN: 7 mg/dL (ref 6–20)
CO2: 23 mmol/L (ref 22–32)
Calcium: 9.2 mg/dL (ref 8.9–10.3)
Chloride: 104 mmol/L (ref 98–111)
Creatinine, Ser: 0.65 mg/dL (ref 0.44–1.00)
GFR, Estimated: >60 mL/min (ref >60)
Glucose, Bld: 98 mg/dL (ref 70–99)
Potassium: 3.2 mmol/L — ABNORMAL LOW (ref 3.5–5.1)
Sodium: 136 mmol/L (ref 135–145)
Total Bilirubin: 0.7 mg/dL (ref 0.3–1.2)
Total Protein: 8.3 g/dL — ABNORMAL HIGH (ref 6.5–8.1)

## 2022-10-27 LAB — CBC
HCT: 35.3 % — ABNORMAL LOW (ref 36.0–46.0)
Hemoglobin: 11.6 g/dL — ABNORMAL LOW (ref 12.0–15.0)
MCH: 34 pg (ref 26.0–34.0)
MCHC: 32.9 g/dL (ref 30.0–36.0)
MCV: 103.5 fL — ABNORMAL HIGH (ref 80.0–100.0)
Platelets: 295 10*3/uL (ref 150–400)
RBC: 3.41 MIL/uL — ABNORMAL LOW (ref 3.87–5.11)
RDW: 14.8 % (ref 11.5–15.5)
WBC: 4.4 10*3/uL (ref 4.0–10.5)
nRBC: 0 % (ref 0.0–0.2)

## 2022-10-27 LAB — TYPE AND SCREEN
ABO/RH(D): O NEG
Antibody Screen: NEGATIVE

## 2022-10-27 LAB — PREGNANCY, URINE: Preg Test, Ur: NEGATIVE

## 2022-10-27 MED ORDER — METHYLPREDNISOLONE SODIUM SUCC 125 MG IJ SOLR
125.0000 mg | Freq: Once | INTRAMUSCULAR | Status: AC
  Administered 2022-10-27: 125 mg via INTRAVENOUS
  Filled 2022-10-27: qty 2

## 2022-10-27 MED ORDER — BUDESONIDE ER 9 MG PO CP24: 9.0000 mg | ORAL_CAPSULE | Freq: Every day | ORAL | 0 refills | Status: DC

## 2022-10-27 MED ORDER — IOHEXOL 300 MG/ML  SOLN
100.0000 mL | Freq: Once | INTRAMUSCULAR | Status: AC | PRN
  Administered 2022-10-27: 100 mL via INTRAVENOUS

## 2022-10-27 MED ORDER — POTASSIUM CHLORIDE CRYS ER 20 MEQ PO TBCR
40.0000 meq | EXTENDED_RELEASE_TABLET | Freq: Once | ORAL | Status: AC
  Administered 2022-10-27: 40 meq via ORAL
  Filled 2022-10-27: qty 2

## 2022-10-27 MED ORDER — SODIUM CHLORIDE 0.9 % IV BOLUS
1000.0000 mL | Freq: Once | INTRAVENOUS | Status: AC
  Administered 2022-10-27: 1000 mL via INTRAVENOUS

## 2022-10-27 NOTE — ED Notes (Signed)
Patient transported to CT 

## 2022-10-27 NOTE — Discharge Instructions (Addendum)
Please follow up with your GI provider this upcoming week. I have sent a prescription for budesonide to your pharmacy for you to take over the next 7 days as you await your GI appointment. Seek emergency care if experiencing any new or worsening symptoms.  I have also attached information about potassium content in foods.  Your potassium was slightly low.    Your CT scan showed evidence of colitis, likely related to your Crohn's disease.  There was also a 2.4 cm right sided ovarian cyst.  No follow up imaging is required.

## 2022-10-27 NOTE — ED Provider Notes (Signed)
Accepted handoff at shift change from [facility] [place]. Please see prior provider note for more detail.   Briefly: Patient is 22 y.o. presents to ED with abdominal pain since last night with associated bloody diarrhea.  Patient has past medical history of Crohn's.  She is complaint with her medications.   DDX: concern for Crohn's flare, gastroenteritis, SBO  Plan: follow up on CT abd/pelvis.  If CT without complications, patient appropriate for discharge home with outpatient GI follow up.    Results for orders placed or performed during the hospital encounter of 10/27/22  Comprehensive metabolic panel  Result Value Ref Range   Sodium 136 135 - 145 mmol/L   Potassium 3.2 (L) 3.5 - 5.1 mmol/L   Chloride 104 98 - 111 mmol/L   CO2 23 22 - 32 mmol/L   Glucose, Bld 98 70 - 99 mg/dL   BUN 7 6 - 20 mg/dL   Creatinine, Ser 6.96 0.44 - 1.00 mg/dL   Calcium 9.2 8.9 - 29.5 mg/dL   Total Protein 8.3 (H) 6.5 - 8.1 g/dL   Albumin 4.5 3.5 - 5.0 g/dL   AST 20 15 - 41 U/L   ALT 12 0 - 44 U/L   Alkaline Phosphatase 41 38 - 126 U/L   Total Bilirubin 0.7 0.3 - 1.2 mg/dL   GFR, Estimated >28 >41 mL/min   Anion gap 9 5 - 15  CBC  Result Value Ref Range   WBC 4.4 4.0 - 10.5 K/uL   RBC 3.41 (L) 3.87 - 5.11 MIL/uL   Hemoglobin 11.6 (L) 12.0 - 15.0 g/dL   HCT 32.4 (L) 40.1 - 02.7 %   MCV 103.5 (H) 80.0 - 100.0 fL   MCH 34.0 26.0 - 34.0 pg   MCHC 32.9 30.0 - 36.0 g/dL   RDW 25.3 66.4 - 40.3 %   Platelets 295 150 - 400 K/uL   nRBC 0.0 0.0 - 0.2 %  Pregnancy, urine  Result Value Ref Range   Preg Test, Ur NEGATIVE NEGATIVE  Type and screen [facility]  Result Value Ref Range   ABO/RH(D) O NEG    Antibody Screen NEG    Sample Expiration      10/30/2022,2359 Performed at [facility], [address], [place], [place] [address]    CT ABDOMEN PELVIS W CONTRAST  Result Date: 10/27/2022 CLINICAL DATA:  Crohn's exacerbation.  Abdominal pain. EXAM: CT  ABDOMEN AND PELVIS WITH CONTRAST TECHNIQUE: Multidetector CT imaging of the abdomen and pelvis was performed using the standard protocol following bolus administration of intravenous contrast. RADIATION DOSE REDUCTION: This exam was performed according to the departmental dose-optimization program which includes automated exposure control, adjustment of the mA and/or kV according to patient size and/or use of iterative reconstruction technique. CONTRAST:  OMNIPAQUE IOHEXOL 300 MG/ML  SOLN COMPARISON:  CT abdomen and pelvis 04/27/2019 FINDINGS: Lower chest: No acute abnormality. Hepatobiliary: No focal liver abnormality is seen. No gallstones, gallbladder wall thickening, or biliary dilatation. Pancreas: Unremarkable. No pancreatic ductal dilatation or surrounding inflammatory changes. Spleen: Normal in size without focal abnormality. Adrenals/Urinary Tract: Adrenal glands are unremarkable. Kidneys are normal, without renal calculi, focal lesion, or hydronephrosis. Bladder is unremarkable. Stomach/Bowel: There is rectosigmoid wall thickening with mild surrounding inflammation. There is no bowel obstruction, pneumatosis or free air. Appendix is not definitely visualized. Small bowel and stomach are within normal limits. Vascular/Lymphatic: No significant vascular findings are present. No enlarged abdominal or pelvic lymph nodes. Reproductive: There is  a 2.4 x 1.1 cm dominant follicle or small cyst in the right ovary. The left ovary and uterus are within normal limits. Other: There is trace free fluid in the right adnexa. Musculoskeletal: No acute or significant osseous findings. IMPRESSION: 1. Rectosigmoid wall thickening with mild surrounding inflammation compatible with colitis. 2. Small amount of free fluid in the right adnexa. 3. Right ovarian simple-appearing cyst measuring 2.4 cm. No follow-up imaging is recommended. Reference: JACR 2020 Feb;17(2):248-254 Electronically Signed   By: Darliss Cheney M.D.    On: 10/27/2022 20:32     Physical Exam  BP 99/71   Pulse 80   Temp 98.6 F (37 C) (Oral)   Resp 18   Ht 4\' 11"  (1.499 m)   Wt 49.9 kg   SpO2 100%   BMI 22.22 kg/m   Physical Exam Vitals and nursing note reviewed.  Constitutional:      General: She is not in acute distress.    Appearance: Normal appearance. She is not ill-appearing or diaphoretic.  Cardiovascular:     Rate and Rhythm: Normal rate and regular rhythm.  Pulmonary:     Effort: Pulmonary effort is normal.  Neurological:     Mental Status: She is alert. Mental status is at baseline.  Psychiatric:        Mood and Affect: Mood normal.        Behavior: Behavior normal.     Procedures  Procedures  ED Course / MDM    Medical Decision Making Amount and/or Complexity of Data Reviewed Labs: ordered. Radiology: ordered.  Risk Prescription drug management.   8:37 PM CT with evidence of colitis.  Small amount of free fluid in right adnexa and right ovarian simple-appearing cyst measuring 2.4 cm.  Prescription for budesonide ER sent in by previous provider.  Will give patient 125 mg of Solu-Medrol IV prior to discharge home as she will be unable to start her medication tonight.    The patient has been appropriately medically screened and/or stabilized in the ED. I have low suspicion for any other emergent medical condition which would require further screening, evaluation or treatment in the ED or require inpatient management. At time of discharge the patient is hemodynamically stable and in no acute distress. I have discussed work-up results and diagnosis with patient and answered all questions. Patient is agreeable with discharge plan. We discussed strict return precautions for returning to the emergency department and they verbalized understanding.           Lenard Simmer, PA-C 10/27/22 2047    Wynetta Fines, MD 10/27/22 [phone]

## 2022-10-27 NOTE — ED Provider Notes (Signed)
[place] EMERGENCY DEPARTMENT AT [facility] Provider Note   CSN: [id number] Arrival date & time: 10/27/22  1405     History  Chief Complaint  Patient presents with   Abdominal Pain   Rectal Bleeding    Claudia Martinez is a 22 y.o. female with PMHx crohns disease who presents to ED concerned for abdominal pain since last night. Patient also endorsing diarrhea with blood that started this morning. Patient has not had a crohn's exacerbation for 5 years and has been complaint on her Azathioprine and Mesalamine. Patient stating that she has had couple of mild flares over the years that slowly progress, but is concerned given that the abdominal pain and bloody diarrhea started so abruptly within the past 24hrs. Patient declined wanting pain medication.   Abdominal Pain Associated symptoms: hematochezia   Rectal Bleeding Associated symptoms: abdominal pain        Home Medications Prior to Admission medications   Medication Sig Start Date End Date Taking? Authorizing Provider  Ascorbic Acid (VITAMIN C) 500 MG CHEW Chew 1 each by mouth daily in the afternoon.    [provider]  azaTHIOprine (IMURAN) 50 MG tablet Take 1 tablet by mouth 2 (two) times daily. 06/09/15   [provider]  [facility] VITAMIN B12 1000 MCG tablet Take 1,000 mcg by mouth daily. 05/21/20   [provider]  ferrous sulfate 325 (65 FE) MG tablet Iron (ferrous sulfate) 325 mg (65 mg iron) tablet  Take 1 tablet every day by oral route.    [provider]  folic acid (FOLVITE) 1 MG tablet Take 1 mg by mouth daily. 08/22/18   [provider]  Multiple Vitamin (MULTIVITAMIN) tablet Take 1 tablet by mouth daily.    [provider]  PENTASA 500 MG CR capsule Take 3 capsules by mouth 2 (two) times daily. 07/30/18   [provider]      Allergies    Other    Review of Systems   Review of Systems  Gastrointestinal:  Positive for abdominal pain and  hematochezia.    Physical Exam Updated Vital Signs BP 127/84   Pulse 100   Temp 98.8 F (37.1 C) (Oral)   Resp 18   SpO2 100%  Physical Exam Vitals and nursing note reviewed.  Constitutional:      General: She is not in acute distress.    Appearance: She is not ill-appearing or toxic-appearing.  HENT:     Head: Normocephalic and atraumatic.     Mouth/Throat:     Mouth: Mucous membranes are moist.     Pharynx: No posterior oropharyngeal erythema.  Eyes:     General: No scleral icterus.       Right eye: No discharge.        Left eye: No discharge.     Conjunctiva/sclera: Conjunctivae normal.  Cardiovascular:     Rate and Rhythm: Normal rate and regular rhythm.     Pulses: Normal pulses.     Heart sounds: Normal heart sounds. No murmur heard. Pulmonary:     Effort: Pulmonary effort is normal. No respiratory distress.     Breath sounds: Normal breath sounds. No wheezing, rhonchi or rales.  Abdominal:     General: Abdomen is flat. Bowel sounds are normal. There is no distension.     Palpations: Abdomen is soft. There is no mass.     Comments: Tenderness to palpation on lower abdominal quadrants  Musculoskeletal:     Right lower leg:  No edema.     Left lower leg: No edema.  Skin:    General: Skin is warm and dry.     Findings: No rash.  Neurological:     General: No focal deficit present.     Mental Status: She is alert. Mental status is at baseline.  Psychiatric:        Mood and Affect: Mood normal.     ED Results / Procedures / Treatments   Labs (all labs ordered are listed, but only abnormal results are displayed) Labs Reviewed  COMPREHENSIVE METABOLIC PANEL - Abnormal; Notable for the following components:      Result Value   Potassium 3.2 (*)    Total Protein 8.3 (*)    All other components within normal limits  CBC - Abnormal; Notable for the following components:   RBC 3.41 (*)    Hemoglobin 11.6 (*)    HCT 35.3 (*)    MCV 103.5 (*)    All other  components within normal limits  HCG, SERUM, QUALITATIVE  POC OCCULT BLOOD, ED  TYPE AND SCREEN    EKG None  Radiology No results found.  Procedures Procedures    Medications Ordered in ED Medications  potassium chloride SA (KLOR-CON M) CR tablet 40 mEq (has no administration in time range)    ED Course/ Medical Decision Making/ A&P                                 Medical Decision Making Amount and/or Complexity of Data Reviewed Labs: ordered.  Risk Prescription drug management.    This patient presents to the ED for concern of lower abdominal pain and hematochezia, this involves an extensive number of treatment options, and is a complaint that carries with it a high risk of complications and morbidity.  The differential diagnosis includes gastroenteritis, colitis, small bowel obstruction, appendicitis, cholecystitis, pancreatitis, nephrolithiasis, UTI, pyleonephritis,    Co morbidities that complicate the patient evaluation  Crohn's disease   Lab Tests:  I Ordered, and personally interpreted labs.  The pertinent results include:   CBC with differential: mild anemia (11.6); no leukocytosis CMP: hypokalemia (3.2) Urine Pregnancy: negative   Imaging Studies ordered:  I ordered imaging studies including   -CT Abd/Pelvis with contrast: evaluate for structural/surgical etiology of patients' severe abdominal pain.  I independently visualized and interpreted imaging I agree with the radiologist interpretation     Problem List / ED Course / Critical interventions / Medication management  Patient presented for abdominal pain and hematochezia. Physical exam with mild lower abdominal tenderness to palpation. Patient afebrile with stable vitals. UPT negative. CMP with mild hyponatremia - provided patient with dose of potassium in ED which she tolerated well. CBC with mild anemia; no leukocytosis. CT abd/pelv pending - searching for complications of crohn's  flair.  8PM Care of [name] transferred to [facility] and Dr. Rodena Medin at the end of my shift as the patient will require reassessment once labs/imaging have resulted. Patient presentation, ED course, and plan of care discussed with review of all pertinent labs and imaging. Please see his/her note for further details regarding further ED course and disposition. Plan at time of handoff is reassess patient after CT results. If without any complications and patient is tolerating PO, patient should be appropriate for outpatient follow up with her GI provider. Patient can also receive 9mg  budesonide for the next 7 days as she  awaits her GI appointment. Patient may also get a dose of steroids here in ED. This may be altered or completely changed at the discretion of the oncoming team pending results of further workup.    Social Determinants of Health:  none           Final Clinical Impression(s) / ED Diagnoses Final diagnoses:  None    Rx / DC Orders ED Discharge Orders     None         Dorthy Cooler, [place] 10/27/22 2005    Anders Simmonds T, DO 10/27/22 2030

## 2022-10-27 NOTE — ED Triage Notes (Signed)
Last night the patient began to have abdominal pain. This AM she noted a lot of blood with stool (diarrhea). History of chron's disease.

## 2022-10-28 ENCOUNTER — Telehealth (HOSPITAL_COMMUNITY): Payer: Self-pay | Admitting: Emergency Medicine

## 2022-10-28 MED ORDER — BUDESONIDE ER 9 MG PO CP24: 9.0000 mg | ORAL_CAPSULE | Freq: Every day | ORAL | 0 refills | Status: DC

## 2022-10-28 MED ORDER — BUDESONIDE ER 9 MG PO CP24: 9.0000 mg | ORAL_CAPSULE | Freq: Every day | ORAL | 0 refills | Status: AC

## 2022-10-28 NOTE — Telephone Encounter (Cosign Needed)
Patient stating that her initial pharmacy does not have 9mg  Budesonide available. Stating that she needs prescription sent to [facility] in [place]. Sending prescription now.

## 2022-10-28 NOTE — Telephone Encounter (Cosign Needed)
Talked with pharmacy to get prescription sorted out. Pharmacy only has 3mg  budesonide. Patient is to take 3 pills once daily over the next 7 days and follow up with her GI provider.

## 2022-11-05 DIAGNOSIS — A498 Other bacterial infections of unspecified site: Secondary | ICD-10-CM | POA: Diagnosis not present

## 2022-11-05 DIAGNOSIS — R197 Diarrhea, unspecified: Secondary | ICD-10-CM | POA: Diagnosis not present

## 2022-11-05 DIAGNOSIS — K5090 Crohn's disease, unspecified, without complications: Secondary | ICD-10-CM | POA: Diagnosis not present

## 2022-11-06 ENCOUNTER — Ambulatory Visit (INDEPENDENT_AMBULATORY_CARE_PROVIDER_SITE_OTHER): Payer: BC Managed Care – PPO | Admitting: Family Medicine

## 2022-11-06 ENCOUNTER — Encounter: Payer: Self-pay | Admitting: Family Medicine

## 2022-11-06 VITALS — BP 120/82 | HR 83 | Temp 97.5°F | Ht 59.0 in | Wt 120.6 lb

## 2022-11-06 DIAGNOSIS — E559 Vitamin D deficiency, unspecified: Secondary | ICD-10-CM

## 2022-11-06 DIAGNOSIS — D649 Anemia, unspecified: Secondary | ICD-10-CM

## 2022-11-06 DIAGNOSIS — E876 Hypokalemia: Secondary | ICD-10-CM | POA: Diagnosis not present

## 2022-11-06 DIAGNOSIS — K5010 Crohn's disease of large intestine without complications: Secondary | ICD-10-CM | POA: Diagnosis not present

## 2022-11-06 LAB — BASIC METABOLIC PANEL
BUN: 11 mg/dL (ref 6–23)
CO2: 25 mEq/L (ref 19–32)
Calcium: 9.2 mg/dL (ref 8.4–10.5)
Chloride: 105 mEq/L (ref 96–112)
Creatinine, Ser: 0.67 mg/dL (ref 0.40–1.20)
GFR: 122.41 mL/min (ref >60.00)
Glucose, Bld: 90 mg/dL (ref 70–99)
Potassium: 3.7 mEq/L (ref 3.5–5.1)
Sodium: 139 mEq/L (ref 135–145)

## 2022-11-06 LAB — IBC + FERRITIN
Ferritin: 33.9 ng/mL (ref 10.0–291.0)
Iron: 79 ug/dL (ref 42–145)
Saturation Ratios: 27.7 % (ref 20.0–50.0)
TIBC: 285.6 ug/dL (ref 250.0–450.0)
Transferrin: 204 mg/dL — ABNORMAL LOW (ref 212.0–360.0)

## 2022-11-06 LAB — CBC
HCT: 36 % (ref 36.0–46.0)
Hemoglobin: 11.6 g/dL — ABNORMAL LOW (ref 12.0–15.0)
MCHC: 32.4 g/dL (ref 30.0–36.0)
MCV: 105.1 fl — ABNORMAL HIGH (ref 78.0–100.0)
Platelets: 257 10*3/uL (ref 150.0–400.0)
RBC: 3.42 Mil/uL — ABNORMAL LOW (ref 3.87–5.11)
RDW: 15.8 % — ABNORMAL HIGH (ref 11.5–15.5)
WBC: 4.7 10*3/uL (ref 4.0–10.5)

## 2022-11-06 LAB — VITAMIN D 25 HYDROXY (VIT D DEFICIENCY, FRACTURES): VITD: 28.33 ng/mL — ABNORMAL LOW (ref 30.00–100.00)

## 2022-11-06 LAB — FOLATE: Folate: >24.2 ng/mL (ref >5.9)

## 2022-11-06 LAB — VITAMIN B12: Vitamin B-12: >1501 pg/mL — ABNORMAL HIGH (ref 211–911)

## 2022-11-06 NOTE — Patient Instructions (Signed)
It was very nice to see you today!  I am glad that you are feeling better.  We will check blood today  Return if symptoms worsen or fail to improve.   Take care, Dr Jimmey Ralph  PLEASE NOTE:  If you had any lab tests, please let us know if you have not heard back within a few days. You may see your results on mychart before we have a chance to review them but we will give you a call once they are reviewed by [place].   If we ordered any referrals today, please let us know if you have not heard from their office within the next week.   If you had any urgent prescriptions sent in today, please check with the pharmacy within an hour of our visit to make sure the prescription was transmitted appropriately.   Please try these tips to maintain a healthy lifestyle:  Eat at least 3 REAL meals and 1-2 snacks per day.  Aim for no more than 5 hours between eating.  If you eat breakfast, please do so within one hour of getting up.   Each meal should contain half fruits/vegetables, one quarter protein, and one quarter carbs (no bigger than a computer mouse)  Cut down on sweet beverages. This includes juice, soda, and sweet tea.   Drink at least 1 glass of water with each meal and aim for at least 8 glasses per day  Exercise at least 150 minutes every week.

## 2022-11-06 NOTE — Assessment & Plan Note (Signed)
Now doing well.  No recurrences since being in the ED.  She will continue current regimen azathioprine 50 mg twice daily, budesonide 9 mg daily, and Pentasa 1500 mg twice daily per GI.

## 2022-11-06 NOTE — Assessment & Plan Note (Signed)
Check vitamin D. 

## 2022-11-06 NOTE — Progress Notes (Signed)
   Claudia Martinez is a 22 y.o. female who presents today for an office visit.  Assessment/Plan:  New/Acute Problems: Hypokalemia Likely due to GI also secondary to recent Crohn's flare.  Will recheck today.  Discussed eating diet high in potassium.  Anemia No red flags.  Likely secondary to Crohn's and regular menstruation.  Will check labs today including iron panel, folate, and B12.  Chronic Problems Addressed Today: Crohn's disease of large intestine without complication (HCC) Now doing well.  No recurrences since being in the ED.  She will continue current regimen azathioprine 50 mg twice daily, budesonide 9 mg daily, and Pentasa 1500 mg twice daily per GI.  Vitamin D deficiency Check vitamin D.     Subjective:  HPI:  See A/P for status of chronic conditions.  Patient is here today for follow-up.  She went to the ED 10 days ago with abdominal pain and bloody diarrhea consistent with Crohn's flare.  CT scan did show evidence of colitis and also a newly found ovarian cyst.  She was given budesonide ER and 125 mg of Solu-Medrol and discharged home.  Her labs were significant for hypokalemia of 3.2.  She has done well the last several days. No recurrence of symptoms. She has follow up with gastroenterology in a couple of weeks.        Objective:  Physical Exam: BP 120/82   Pulse 83   Temp (!) 97.5 F (36.4 C) (Temporal)   Ht 4\' 11"  (1.499 m)   Wt 120 lb 9.6 oz (54.7 kg)   LMP 10/18/2022   SpO2 100%   BMI 24.36 kg/m   Gen: No acute distress, resting comfortably CV: Regular rate and rhythm with no murmurs appreciated Pulm: Normal work of breathing, clear to auscultation bilaterally with no crackles, wheezes, or rhonchi Neuro: Grossly normal, moves all extremities Psych: Normal affect and thought content      Claudia Martinez M. Jimmey Ralph, MD 11/06/2022 9:30 AM

## 2022-11-08 NOTE — Progress Notes (Signed)
Her vitamin D is a little low.  Recommend she start 1000 to 2000 IUs daily and we can recheck again in 3 to 6 months.  Her B12 and folate levels are at goal.    Her iron levels are on the low side of normal but overall stable.  It would be fine for her to take iron supplementation ferrous sulfate 65 mg every other day on an empty stomach. We can recheck again in 3-6 months.   Everything else is stable.  Claudia Martinez. Jimmey Ralph, MD 11/08/2022 9:49 AM

## 2022-11-13 ENCOUNTER — Telehealth: Payer: Self-pay | Admitting: Family Medicine

## 2022-11-13 NOTE — Telephone Encounter (Signed)
See results note. 

## 2022-11-13 NOTE — Telephone Encounter (Signed)
Patient's mother called on behalf of patient in regards to lab results. Caller is requesting patient be called with those results and any instructions/recommendations as needed.

## 2022-11-19 DIAGNOSIS — A498 Other bacterial infections of unspecified site: Secondary | ICD-10-CM | POA: Diagnosis not present

## 2022-11-19 DIAGNOSIS — K50811 Crohn's disease of both small and large intestine with rectal bleeding: Secondary | ICD-10-CM | POA: Diagnosis not present

## 2022-12-05 DIAGNOSIS — K5289 Other specified noninfective gastroenteritis and colitis: Secondary | ICD-10-CM | POA: Diagnosis not present

## 2022-12-05 DIAGNOSIS — K6289 Other specified diseases of anus and rectum: Secondary | ICD-10-CM | POA: Diagnosis not present

## 2022-12-05 DIAGNOSIS — R933 Abnormal findings on diagnostic imaging of other parts of digestive tract: Secondary | ICD-10-CM | POA: Diagnosis not present

## 2022-12-05 DIAGNOSIS — K626 Ulcer of anus and rectum: Secondary | ICD-10-CM | POA: Diagnosis not present

## 2022-12-05 DIAGNOSIS — K633 Ulcer of intestine: Secondary | ICD-10-CM | POA: Diagnosis not present

## 2022-12-05 DIAGNOSIS — K922 Gastrointestinal hemorrhage, unspecified: Secondary | ICD-10-CM | POA: Diagnosis not present

## 2022-12-05 DIAGNOSIS — K635 Polyp of colon: Secondary | ICD-10-CM | POA: Diagnosis not present

## 2022-12-05 DIAGNOSIS — K625 Hemorrhage of anus and rectum: Secondary | ICD-10-CM | POA: Diagnosis not present

## 2022-12-05 DIAGNOSIS — K6389 Other specified diseases of intestine: Secondary | ICD-10-CM | POA: Diagnosis not present

## 2022-12-05 LAB — HM COLONOSCOPY

## 2023-01-14 DIAGNOSIS — Z8619 Personal history of other infectious and parasitic diseases: Secondary | ICD-10-CM | POA: Diagnosis not present

## 2023-01-14 DIAGNOSIS — K5010 Crohn's disease of large intestine without complications: Secondary | ICD-10-CM | POA: Diagnosis not present

## 2023-02-11 DIAGNOSIS — Z780 Asymptomatic menopausal state: Secondary | ICD-10-CM | POA: Diagnosis not present

## 2023-02-11 DIAGNOSIS — M810 Age-related osteoporosis without current pathological fracture: Secondary | ICD-10-CM | POA: Diagnosis not present

## 2023-02-11 LAB — HM DEXA SCAN

## 2023-02-14 DIAGNOSIS — E559 Vitamin D deficiency, unspecified: Secondary | ICD-10-CM | POA: Diagnosis not present

## 2023-02-18 DIAGNOSIS — Z1330 Encounter for screening examination for mental health and behavioral disorders, unspecified: Secondary | ICD-10-CM | POA: Diagnosis not present

## 2023-02-18 DIAGNOSIS — Z01419 Encounter for gynecological examination (general) (routine) without abnormal findings: Secondary | ICD-10-CM | POA: Diagnosis not present

## 2023-04-22 DIAGNOSIS — Z8619 Personal history of other infectious and parasitic diseases: Secondary | ICD-10-CM | POA: Diagnosis not present

## 2023-04-23 DIAGNOSIS — Z8619 Personal history of other infectious and parasitic diseases: Secondary | ICD-10-CM | POA: Diagnosis not present

## 2023-07-05 DIAGNOSIS — K5010 Crohn's disease of large intestine without complications: Secondary | ICD-10-CM | POA: Diagnosis not present

## 2023-07-30 ENCOUNTER — Encounter: Payer: Self-pay | Admitting: Family Medicine

## 2023-07-30 ENCOUNTER — Ambulatory Visit (INDEPENDENT_AMBULATORY_CARE_PROVIDER_SITE_OTHER): Admitting: Family Medicine

## 2023-07-30 VITALS — BP 110/72 | HR 101 | Temp 99.3°F | Ht 59.0 in | Wt 125.4 lb

## 2023-07-30 DIAGNOSIS — E559 Vitamin D deficiency, unspecified: Secondary | ICD-10-CM

## 2023-07-30 DIAGNOSIS — J020 Streptococcal pharyngitis: Secondary | ICD-10-CM | POA: Diagnosis not present

## 2023-07-30 DIAGNOSIS — K5010 Crohn's disease of large intestine without complications: Secondary | ICD-10-CM | POA: Diagnosis not present

## 2023-07-30 DIAGNOSIS — Z0001 Encounter for general adult medical examination with abnormal findings: Secondary | ICD-10-CM

## 2023-07-30 LAB — HEMOGLOBIN A1C: Hgb A1c MFr Bld: 5.2 % (ref 4.6–6.5)

## 2023-07-30 LAB — LIPID PANEL
Cholesterol: 149 mg/dL (ref 0–200)
HDL: 66.4 mg/dL (ref >39.00)
LDL Cholesterol: 74 mg/dL (ref 0–99)
NonHDL: 82.94
Total CHOL/HDL Ratio: 2
Triglycerides: 44 mg/dL (ref 0.0–149.0)
VLDL: 8.8 mg/dL (ref 0.0–40.0)

## 2023-07-30 LAB — CBC
HCT: 37.3 % (ref 36.0–46.0)
Hemoglobin: 12.6 g/dL (ref 12.0–15.0)
MCHC: 33.7 g/dL (ref 30.0–36.0)
MCV: 98.9 fl (ref 78.0–100.0)
Platelets: 224 10*3/uL (ref 150.0–400.0)
RBC: 3.77 Mil/uL — ABNORMAL LOW (ref 3.87–5.11)
RDW: 14.6 % (ref 11.5–15.5)
WBC: 5.1 10*3/uL (ref 4.0–10.5)

## 2023-07-30 LAB — POCT RAPID STREP A (OFFICE): Rapid Strep A Screen: POSITIVE — AB

## 2023-07-30 LAB — COMPREHENSIVE METABOLIC PANEL WITH GFR
ALT: 6 U/L (ref 0–35)
AST: 16 U/L (ref 0–37)
Albumin: 4.7 g/dL (ref 3.5–5.2)
Alkaline Phosphatase: 40 U/L (ref 39–117)
BUN: 8 mg/dL (ref 6–23)
CO2: 24 meq/L (ref 19–32)
Calcium: 9.7 mg/dL (ref 8.4–10.5)
Chloride: 103 meq/L (ref 96–112)
Creatinine, Ser: 0.66 mg/dL (ref 0.40–1.20)
GFR: 122.23 mL/min (ref >60.00)
Glucose, Bld: 89 mg/dL (ref 70–99)
Potassium: 3.8 meq/L (ref 3.5–5.1)
Sodium: 138 meq/L (ref 135–145)
Total Bilirubin: 0.4 mg/dL (ref 0.2–1.2)
Total Protein: 7.9 g/dL (ref 6.0–8.3)

## 2023-07-30 LAB — IBC + FERRITIN
Ferritin: 57.9 ng/mL (ref 10.0–291.0)
Iron: 57 ug/dL (ref 42–145)
Saturation Ratios: 20.9 % (ref 20.0–50.0)
TIBC: 273 ug/dL (ref 250.0–450.0)
Transferrin: 195 mg/dL — ABNORMAL LOW (ref 212.0–360.0)

## 2023-07-30 LAB — FOLATE: Folate: >23.2 ng/mL (ref >5.9)

## 2023-07-30 LAB — TSH: TSH: 1.72 u[IU]/mL (ref 0.35–5.50)

## 2023-07-30 LAB — C-REACTIVE PROTEIN: CRP: <1 mg/dL (ref 0.5–20.0)

## 2023-07-30 LAB — SEDIMENTATION RATE: Sed Rate: 21 mm/h — ABNORMAL HIGH (ref 0–20)

## 2023-07-30 LAB — VITAMIN D 25 HYDROXY (VIT D DEFICIENCY, FRACTURES): VITD: 46.87 ng/mL (ref 30.00–100.00)

## 2023-07-30 LAB — VITAMIN B12: Vitamin B-12: >1500 pg/mL — ABNORMAL HIGH (ref 211–911)

## 2023-07-30 MED ORDER — BENZONATATE 200 MG PO CAPS: 200.0000 mg | ORAL_CAPSULE | Freq: Two times a day (BID) | ORAL | 0 refills | Status: AC | PRN

## 2023-07-30 MED ORDER — AMOXICILLIN 500 MG PO CAPS: 500.0000 mg | ORAL_CAPSULE | Freq: Three times a day (TID) | ORAL | 0 refills | Status: AC

## 2023-07-30 NOTE — Assessment & Plan Note (Signed)
 Unfortunately had a flareup last year but is doing well since.  Follows with GI and will be seeing them again in few months.  She is currently on azathioprine 50 mg twice daily and Pentasa 1500 mg twice daily per GI.  Check labs today per patient request.

## 2023-07-30 NOTE — Patient Instructions (Signed)
 It was very nice to see you today!  You have strep throat.  Start amoxicillin.  Use the Tessalon as needed.  Will check blood work today.  Please continue to work on diet and exercise.  Ask your gastroenterologist about if you should get the pneumonia vaccine.  I will see back in year for your next physical.  Come back sooner if needed.  Return in about 1 year (around 07/29/2024) for Annual Physical.   Take care, Dr Daneil Dunker  PLEASE NOTE:  If you had any lab tests, please let us  know if you have not heard back within a few days. You may see your results on mychart before we have a chance to review them but we will give you a call once they are reviewed by us .   If we ordered any referrals today, please let us  know if you have not heard from their office within the next week.   If you had any urgent prescriptions sent in today, please check with the pharmacy within an hour of our visit to make sure the prescription was transmitted appropriately.   Please try these tips to maintain a healthy lifestyle:  Eat at least 3 REAL meals and 1-2 snacks per day.  Aim for no more than 5 hours between eating.  If you eat breakfast, please do so within one hour of getting up.   Each meal should contain half fruits/vegetables, one quarter protein, and one quarter carbs (no bigger than a computer mouse)  Cut down on sweet beverages. This includes juice, soda, and sweet tea.   Drink at least 1 glass of water with each meal and aim for at least 8 glasses per day  Exercise at least 150 minutes every week.

## 2023-07-30 NOTE — Assessment & Plan Note (Signed)
 Check vitamin D.

## 2023-07-30 NOTE — Progress Notes (Signed)
 Chief Complaint:  Claudia Martinez is a 23 y.o. female who presents today for her annual comprehensive physical exam.    Assessment/Plan:  New/Acute Problems: Strep Throat  Rapid strep positive. No red flags. Will start amoxicillin. Also start tessalon for cough. Encouraged hydration. Can use OTC medications as needed as well.   Chronic Problems Addressed Today: Crohn's disease of large intestine without complication (HCC) Unfortunately had a flareup last year but is doing well since.  Follows with GI and will be seeing them again in few months.  She is currently on azathioprine 50 mg twice daily and Pentasa 1500 mg twice daily per GI.  Check labs today per patient request.  Vitamin D  deficiency Check vitamin D .  Preventative Healthcare: Check labs.  Discussed pneumonia vaccine however she will hold off on this for now.  Follows with GYN for women's health and is up-to-date on her Pap.  Patient Counseling(The following topics were reviewed and/or handout was given):  -Nutrition: Stressed importance of moderation in sodium/caffeine intake, saturated fat and cholesterol, caloric balance, sufficient intake of fresh fruits, vegetables, and fiber.  -Stressed the importance of regular exercise.   -Substance Abuse: Discussed cessation/primary prevention of tobacco, alcohol, or other drug use; driving or other dangerous activities under the influence; availability of treatment for abuse.   -Injury prevention: Discussed safety belts, safety helmets, smoke detector, smoking near bedding or upholstery.   -Sexuality: Discussed sexually transmitted diseases, partner selection, use of condoms, avoidance of unintended pregnancy and contraceptive alternatives.   -Dental health: Discussed importance of regular tooth brushing, flossing, and dental visits.  -Health maintenance and immunizations reviewed. Please refer to Health maintenance section.  Return to care in 1 year for next preventative visit.      Subjective:  HPI:  See A/P for status of chronic conditions.  Patient is here today for annual physical.  She has had a sore throat for the last few days.  This is improving a little bit.  Having a lot of cough as well.  No fevers or chills.  No specific treatments tried.  Had a hard time sleeping last night due to the cough.   Lifestyle Diet: Plenty of fruits and vegetables.  Exercise: Working on treadmill daily.      07/30/2023   11:12 AM  Depression screen PHQ 2/9  Decreased Interest 0  Down, Depressed, Hopeless 0  PHQ - 2 Score 0    Health Maintenance Due  Topic Date Due   Pneumococcal Vaccine 39-74 Years old (1 of 1 - PPSV23) 12/21/2013   COVID-19 Vaccine (6 - 2024-25 season) 11/11/2022     ROS: Per HPI, otherwise a complete review of systems was negative.   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 No pertinent surgical history.  History reviewed. No pertinent family history.  Medications- reviewed and updated Current Outpatient Medications  Medication Sig Dispense Refill   amoxicillin (AMOXIL) 500 MG capsule Take 1 capsule (500 mg total) by mouth 3 (three) times daily for 10 days. 30 capsule 0   Ascorbic Acid (VITAMIN C) 500 MG CHEW Chew 1 each by mouth  daily in the afternoon.     azaTHIOprine (IMURAN) 50 MG tablet Take 1 tablet by mouth 2 (two) times daily.     benzonatate (TESSALON) 200 MG capsule Take 1 capsule (200 mg total) by mouth 2 (two) times daily as needed for cough. 20 capsule 0   [facility]S VITAMIN B12 1000 MCG tablet Take 1,000 mcg by mouth daily.      ferrous sulfate 325 (65 FE) MG tablet Iron (ferrous sulfate) 325 mg (65 mg iron) tablet  Take 1 tablet every day by oral route.     folic acid  (FOLVITE ) 1 MG tablet Take 1 mg by mouth daily.     Multiple Vitamin (MULTIVITAMIN) tablet Take 1 tablet by mouth daily.     PENTASA 500 MG CR capsule Take 3 capsules by mouth 2 (two) times daily.     No current facility-administered medications for this visit.    Allergies-reviewed and updated Allergies  Allergen Reactions   Other     Social History   Socioeconomic History   Marital status: Single    Spouse name: Not on file   Number of children: Not on file   Years of education: Not on file   Highest education level: Not on file  Occupational History   Occupation: Consulting civil engineer; online, college   Occupation: Solicitor at [employer]  Tobacco Use   Smoking status: Never   Smokeless tobacco: Never  Vaping Use   Vaping status: Never Used  Substance and Sexual Activity   Alcohol use: Never   Drug use: Never   Sexual activity: Never  Other Topics Concern   Not on file  Social History Narrative   Lives with parents; originally from [place]   Has 2 older siblings.    Social Drivers of Corporate investment banker Strain: Not on file  Food Insecurity: Not on file  Transportation Needs: Not on file  Physical Activity: Not on file  Stress: Not on file  Social Connections: Unknown (07/25/2021)   [facility]   Social Network    Social Network: Not on file        Objective:  Physical Exam: BP 110/72   Pulse (!) 101   Temp 99.3 F (37.4 C) (Temporal)   Ht 4\' 11"  (1.499 m)   Wt 125 lb 6.4 oz (56.9 kg)   LMP 07/11/2023   SpO2 99%   BMI 25.33 kg/m   Body mass index is 25.33 kg/m. Wt Readings from Last 3 Encounters:  07/30/23 125 lb 6.4 oz (56.9 kg)  11/06/22 120 lb 9.6 oz (54.7 kg)  10/27/22 110 lb (49.9 kg)   Gen: NAD, resting comfortably HEENT: TMs normal bilaterally. OP erythematous. No thyromegaly  noted.  CV: RRR with no murmurs appreciated Pulm: NWOB, CTAB with no crackles, wheezes, or rhonchi GI: Normal bowel sounds present. Soft, Nontender, Nondistended. MSK: no edema, cyanosis, or clubbing noted Skin: warm, dry Neuro: CN2-12 grossly intact. Strength 5/5 in upper and lower extremities. Reflexes symmetric and intact bilaterally.  Psych: Normal affect and thought content     Kandice Schmelter M. Daneil Dunker, MD 07/30/2023 11:49 AM

## 2023-07-31 ENCOUNTER — Ambulatory Visit: Payer: Self-pay | Admitting: Family Medicine

## 2023-07-31 NOTE — Progress Notes (Signed)
 Overall her labs are all stable.  She does have very mild elevation in sedimentation rate however this mild elevation is likely not significant.  Her vitamin levels are at goal.  Do not need to make any changes to treatment plan.  We can recheck everything in a year or so.

## 2023-10-30 DIAGNOSIS — K5010 Crohn's disease of large intestine without complications: Secondary | ICD-10-CM | POA: Diagnosis not present

## 2023-11-15 ENCOUNTER — Telehealth: Payer: Self-pay | Admitting: *Deleted

## 2023-11-15 ENCOUNTER — Other Ambulatory Visit: Payer: Self-pay | Admitting: *Deleted

## 2023-11-15 MED ORDER — COVID-19 MRNA VACC (MODERNA) 50 MCG/0.5ML IM SUSP: 0.5000 mL | Freq: Once | INTRAMUSCULAR | 0 refills | Status: AC

## 2023-11-15 NOTE — Telephone Encounter (Signed)
 Copied from CRM [id number]. Topic: General - Other >> Nov 15, 2023  3:25 PM Burnard DEL wrote: Reason for CRM: Patient would like to know if she could have prescription for covid vaccine sent to pharmacy.They are now requiring a prescription.  [id number] [place], [place] - [address]  Phone: [phone] Fax: [phone]   Rx send to Pharmacy  [facility]

## 2024-02-25 DIAGNOSIS — Z01419 Encounter for gynecological examination (general) (routine) without abnormal findings: Secondary | ICD-10-CM | POA: Diagnosis not present

## 2024-02-25 DIAGNOSIS — Z1330 Encounter for screening examination for mental health and behavioral disorders, unspecified: Secondary | ICD-10-CM | POA: Diagnosis not present

## 2024-03-24 ENCOUNTER — Telehealth: Payer: Self-pay

## 2024-03-24 ENCOUNTER — Other Ambulatory Visit: Payer: Self-pay | Admitting: Family Medicine

## 2024-03-24 ENCOUNTER — Other Ambulatory Visit: Payer: Self-pay | Admitting: *Deleted

## 2024-03-24 DIAGNOSIS — Z20818 Contact with and (suspected) exposure to other bacterial communicable diseases: Secondary | ICD-10-CM

## 2024-03-24 DIAGNOSIS — A329 Listeriosis, unspecified: Secondary | ICD-10-CM

## 2024-03-24 NOTE — Progress Notes (Signed)
 li

## 2024-03-24 NOTE — Telephone Encounter (Signed)
 Left message to return call to our office at their convenience.   Future lab order

## 2024-03-24 NOTE — Telephone Encounter (Signed)
 We can have her come in for GI pathogen panel testing which is a stool study if needed.

## 2024-03-24 NOTE — Telephone Encounter (Signed)
 Please review and advise on if patient should come in for an appointment or what should be ordered. Please and thank you.   Copied from CRM [id number]. Topic: Clinical - Request for Lab/Test Order >> Mar 24, 2024 11:11 AM China J wrote: Reason for CRM: Patient's mother is concerned because the patient had ate a recently recalled cheese and when she called the FDA, they instructed her to call the doctor's office and get testing for listeria.   The patient has Crohn's Disease and they want to be sure all precautions are taken. (Patient is asymptomatic at this time)  Please call [phone] for an update.

## 2024-03-25 ENCOUNTER — Other Ambulatory Visit

## 2024-03-25 DIAGNOSIS — A329 Listeriosis, unspecified: Secondary | ICD-10-CM

## 2024-03-25 DIAGNOSIS — Z20818 Contact with and (suspected) exposure to other bacterial communicable diseases: Secondary | ICD-10-CM

## 2024-03-25 NOTE — Telephone Encounter (Signed)
 LVM give us  a call to schedule a lab appt  Order are placed

## 2024-03-28 LAB — GASTROINTESTINAL PATHOGEN PNL
CampyloBacter Group: NOT DETECTED
Norovirus GI/GII: NOT DETECTED
Rotavirus A: NOT DETECTED
Salmonella species: NOT DETECTED
Shiga Toxin 1: NOT DETECTED
Shiga Toxin 2: NOT DETECTED
Shigella Species: NOT DETECTED
Vibrio Group: NOT DETECTED
Yersinia enterocolitica: NOT DETECTED

## 2024-03-30 LAB — STOOL CULTURE: E coli, Shiga toxin Assay: NEGATIVE

## 2024-04-03 ENCOUNTER — Ambulatory Visit: Payer: Self-pay | Admitting: Family Medicine

## 2024-04-03 NOTE — Progress Notes (Signed)
 Stool sample negative for infection.

## 2024-07-30 ENCOUNTER — Encounter: Admitting: Family Medicine
